# Patient Record
Sex: Female | Born: 1965
Health system: Southern US, Community
[De-identification: ages and names within clinical notes are randomized; demographics above are authoritative.]

## PROBLEM LIST (undated history)

## (undated) DIAGNOSIS — J45909 Unspecified asthma, uncomplicated: Secondary | ICD-10-CM

## (undated) DIAGNOSIS — Z9889 Other specified postprocedural states: Secondary | ICD-10-CM

## (undated) DIAGNOSIS — I10 Essential (primary) hypertension: Secondary | ICD-10-CM

## (undated) DIAGNOSIS — R112 Nausea with vomiting, unspecified: Secondary | ICD-10-CM

---

## 2004-08-07 DIAGNOSIS — Z9889 Other specified postprocedural states: Secondary | ICD-10-CM

## 2004-08-07 DIAGNOSIS — R112 Nausea with vomiting, unspecified: Secondary | ICD-10-CM

## 2004-08-07 HISTORY — PX: TUBAL LIGATION: SHX77

## 2004-08-07 HISTORY — DX: Nausea with vomiting, unspecified: Z98.890

## 2004-08-07 HISTORY — DX: Nausea with vomiting, unspecified: R11.2

## 2004-12-30 ENCOUNTER — Other Ambulatory Visit: Admission: RE | Admit: 2004-12-30 | Discharge: 2004-12-30 | Payer: Self-pay | Admitting: Obstetrics and Gynecology

## 2005-01-25 ENCOUNTER — Ambulatory Visit (HOSPITAL_COMMUNITY): Admission: RE | Admit: 2005-01-25 | Discharge: 2005-01-25 | Payer: Self-pay | Admitting: Obstetrics and Gynecology

## 2005-05-15 ENCOUNTER — Emergency Department (HOSPITAL_COMMUNITY): Admission: EM | Admit: 2005-05-15 | Discharge: 2005-05-15 | Payer: Self-pay | Admitting: Emergency Medicine

## 2005-07-12 ENCOUNTER — Emergency Department (HOSPITAL_COMMUNITY): Admission: EM | Admit: 2005-07-12 | Discharge: 2005-07-12 | Payer: Self-pay | Admitting: Emergency Medicine

## 2005-07-17 ENCOUNTER — Emergency Department (HOSPITAL_COMMUNITY): Admission: EM | Admit: 2005-07-17 | Discharge: 2005-07-17 | Payer: Self-pay | Admitting: Emergency Medicine

## 2006-01-08 ENCOUNTER — Emergency Department (HOSPITAL_COMMUNITY): Admission: EM | Admit: 2006-01-08 | Discharge: 2006-01-09 | Payer: Self-pay | Admitting: Emergency Medicine

## 2006-01-09 ENCOUNTER — Other Ambulatory Visit: Admission: RE | Admit: 2006-01-09 | Discharge: 2006-01-09 | Payer: Self-pay | Admitting: Obstetrics and Gynecology

## 2006-02-09 ENCOUNTER — Ambulatory Visit (HOSPITAL_COMMUNITY): Admission: RE | Admit: 2006-02-09 | Discharge: 2006-02-09 | Payer: Self-pay | Admitting: Obstetrics and Gynecology

## 2006-02-09 ENCOUNTER — Encounter (INDEPENDENT_AMBULATORY_CARE_PROVIDER_SITE_OTHER): Payer: Self-pay | Admitting: *Deleted

## 2006-05-28 ENCOUNTER — Emergency Department (HOSPITAL_COMMUNITY): Admission: EM | Admit: 2006-05-28 | Discharge: 2006-05-28 | Payer: Self-pay | Admitting: Emergency Medicine

## 2007-02-04 ENCOUNTER — Other Ambulatory Visit: Admission: RE | Admit: 2007-02-04 | Discharge: 2007-02-04 | Payer: Self-pay | Admitting: Obstetrics and Gynecology

## 2008-01-01 ENCOUNTER — Ambulatory Visit: Payer: Self-pay | Admitting: Internal Medicine

## 2008-01-02 ENCOUNTER — Ambulatory Visit: Payer: Self-pay | Admitting: *Deleted

## 2008-02-18 ENCOUNTER — Ambulatory Visit: Payer: Self-pay | Admitting: Internal Medicine

## 2008-02-18 LAB — CONVERTED CEMR LAB
AST: 14 units/L (ref 0–37)
Alkaline Phosphatase: 51 units/L (ref 39–117)
Basophils Absolute: 0 10*3/uL (ref 0.0–0.1)
Basophils Relative: 0 % (ref 0–1)
Chloride: 105 meq/L (ref 96–112)
Creatinine, Ser: 0.62 mg/dL (ref 0.40–1.20)
Eosinophils Absolute: 0.2 10*3/uL (ref 0.0–0.7)
Eosinophils Relative: 2 % (ref 0–5)
HCT: 39.3 % (ref 36.0–46.0)
HDL: 36 mg/dL — ABNORMAL LOW (ref >39)
Hemoglobin, Urine: NEGATIVE
Ketones, ur: NEGATIVE mg/dL
LDL Cholesterol: 127 mg/dL — ABNORMAL HIGH (ref 0–99)
Lymphocytes Relative: 41 % (ref 12–46)
MCV: 83.1 fL (ref 78.0–100.0)
Monocytes Relative: 7 % (ref 3–12)
Neutro Abs: 4.8 10*3/uL (ref 1.7–7.7)
Neutrophils Relative %: 49 % (ref 43–77)
Platelets: 354 10*3/uL (ref 150–400)
Potassium: 4.2 meq/L (ref 3.5–5.3)
Protein, ur: NEGATIVE mg/dL
RBC: 4.73 M/uL (ref 3.87–5.11)
RDW: 13.5 % (ref 11.5–15.5)
Sodium: 138 meq/L (ref 135–145)
Total CHOL/HDL Ratio: 5.2
Triglycerides: 117 mg/dL (ref <150)

## 2008-02-19 ENCOUNTER — Encounter (INDEPENDENT_AMBULATORY_CARE_PROVIDER_SITE_OTHER): Payer: Self-pay | Admitting: Internal Medicine

## 2008-02-29 ENCOUNTER — Encounter: Payer: Self-pay | Admitting: Internal Medicine

## 2008-02-29 ENCOUNTER — Ambulatory Visit: Payer: Self-pay | Admitting: Internal Medicine

## 2008-03-06 ENCOUNTER — Ambulatory Visit (HOSPITAL_COMMUNITY): Admission: RE | Admit: 2008-03-06 | Discharge: 2008-03-06 | Payer: Self-pay | Admitting: Internal Medicine

## 2008-03-17 ENCOUNTER — Encounter: Admission: RE | Admit: 2008-03-17 | Discharge: 2008-03-17 | Payer: Self-pay | Admitting: Internal Medicine

## 2008-12-11 ENCOUNTER — Emergency Department (HOSPITAL_COMMUNITY): Admission: EM | Admit: 2008-12-11 | Discharge: 2008-12-11 | Payer: Self-pay | Admitting: Emergency Medicine

## 2008-12-11 ENCOUNTER — Encounter: Admission: RE | Admit: 2008-12-11 | Discharge: 2008-12-11 | Payer: Self-pay | Admitting: Internal Medicine

## 2009-03-09 ENCOUNTER — Ambulatory Visit (HOSPITAL_COMMUNITY): Admission: RE | Admit: 2009-03-09 | Discharge: 2009-03-09 | Payer: Self-pay | Admitting: Internal Medicine

## 2010-02-14 ENCOUNTER — Ambulatory Visit (HOSPITAL_COMMUNITY): Admission: RE | Admit: 2010-02-14 | Discharge: 2010-02-14 | Payer: Self-pay | Admitting: Obstetrics and Gynecology

## 2010-08-29 ENCOUNTER — Encounter: Payer: Self-pay | Admitting: Internal Medicine

## 2010-09-01 ENCOUNTER — Other Ambulatory Visit (HOSPITAL_COMMUNITY): Payer: Self-pay | Admitting: Obstetrics and Gynecology

## 2010-09-01 DIAGNOSIS — Z1239 Encounter for other screening for malignant neoplasm of breast: Secondary | ICD-10-CM

## 2010-09-01 DIAGNOSIS — Z1231 Encounter for screening mammogram for malignant neoplasm of breast: Secondary | ICD-10-CM

## 2010-10-20 ENCOUNTER — Other Ambulatory Visit (HOSPITAL_COMMUNITY): Payer: Self-pay | Admitting: Obstetrics and Gynecology

## 2010-10-20 ENCOUNTER — Ambulatory Visit (HOSPITAL_COMMUNITY)
Admission: RE | Admit: 2010-10-20 | Discharge: 2010-10-20 | Disposition: A | Discharge status: Home / Self Care | Payer: 59 | Source: Ambulatory Visit | Attending: Obstetrics and Gynecology | Admitting: Obstetrics and Gynecology

## 2010-10-20 ENCOUNTER — Other Ambulatory Visit: Payer: Self-pay | Admitting: Obstetrics and Gynecology

## 2010-10-20 DIAGNOSIS — Z1231 Encounter for screening mammogram for malignant neoplasm of breast: Secondary | ICD-10-CM

## 2010-12-23 NOTE — Op Note (Signed)
[name], [name]               ACCOUNT NO.:  [id number]   MEDICAL RECORD NO.:  [id number]          PATIENT TYPE:  AMB   LOCATION:  SDC                           FACILITY:  [facility]   PHYSICIAN:  James A. Ashley Royalty, M.D.DATE OF BIRTH:  [birth year]   DATE OF PROCEDURE:  01/25/2005  DATE OF DISCHARGE:                                 OPERATIVE REPORT   PREOPERATIVE DIAGNOSIS:  Desire for attempted permanent sterilization.   POSTOPERATIVE DIAGNOSIS:  Desire for attempted permanent sterilization.   OPERATION/PROCEDURE:  Laparoscopic bilateral tubal sterilization procedure  (Falope ring).   SURGEON:  Rudy Jew. Ashley Royalty, M.D.   ANESTHESIA:  General.   ESTIMATED BLOOD LOSS:  50 mL.   COMPLICATIONS:  None.   PACKS AND DRAINS:  None.   DESCRIPTION OF PROCEDURE:  The patient was taken to the operating room and  placed in the dorsal supine position.  After general anesthesia was  administered, she was prepped and draped in the usual sterile manner for  abdominal and vaginal surgery.  Posterior weighted retractor was placed per  vagina.  A anterior lip of the cervix grasped with a single-tooth tenaculum  and the Jarcho uterine manipulator was placed per cervix and held in place  with the tenaculum.  The bladder was drained with the red rubber catheter.   A 1.5 cm infraumbilical incision was made in the longitudinal plane.  Veress  needle was inserted into the abdominal cavity using instillation of saline  and hanging drop techniques.  Approximately 3 L of CO2 were instilled at 1 L  per minute to create a pneumoperitoneum.  Next, a disposable size 11 trocar  was placed in the abdominal cavity.  Its location was verified by placement  of the laparoscope.  There was no evidence of any trauma.  The size 8 mm  Falope ring trocar was placed in the suprapubic region in the midline using  transillumination and direct visualization techniques.  The pelvis was  thoroughly surveyed.  The uterus was  normal size, shape and contour without  evidence of any fibroids or endometriosis.  The ovaries were normal size,  shape and contour without evidence of any cysts or endometriosis.  Fallopian  tubes were normal size, shape, contour, and length with luxuriant fimbria.  Appropriate photos were obtained.   Attention was then turned to the tubal sterilization procedure.  The right  fallopian tube was grasped and traced to its fimbriated end.  The Falope  ring was applied to the distal ampullary portion.  An excellent knuckle of  tube was noted to be contained within the ring.  Excellent blanching of  tissue was noted.  Next, the left fallopian tube was grasped and traced to  its fimbriated end.  An avascular area in the distal portion of the isthmic  region was chosen for ring placement.  Falope ring was applied without  difficulty.  An excellent knuckle of tube was noted to be contained within  the ring.  Excellent blanching of tissue was noted.  __________ were  obtained.   At this point the patient was felt to have  benefitted maximally from the  surgical procedure.  The abdominal instruments were removed and  pneumoperitoneum evacuated.  Fascial defects were closed with 0 Vicryl in an  interrupted fashion.  The skin was closed with 3-0 Monocryl in a  subcuticular fashion.  The vaginal instruments were removed and procedure  terminated.  Prior to termination, approximately 10 mL of 0.25% bupivacaine  was instilled into the incisions to aid in postoperative analgesia.   The patient tolerated the procedure extremely well and was returned to the  recovery room in good condition.       JAM/MEDQ  D:  01/25/2005  T:  01/26/2005  Job:  [id number]

## 2010-12-23 NOTE — Op Note (Signed)
[name], [name]               ACCOUNT NO.:  [id number]   MEDICAL RECORD NO.:  [id number]          PATIENT TYPE:  AMB   LOCATION:  SDC                           [facility]   PHYSICIAN:  James A. Ashley Royalty, M.D.DATE OF BIRTH:  [birth year]   DATE OF PROCEDURE:  02/09/2006  DATE OF DISCHARGE:                                 OPERATIVE REPORT   PREOPERATIVE DIAGNOSES:  1.  Abnormal uterine bleeding.  2.  Pelvic pain.   POSTOPERATIVE DIAGNOSES:  1.  Abnormal uterine bleeding.  2.  Probable endometriosis, pathology pending.   OPERATION PERFORMED:  1.  Diagnostic hysteroscopy.  2.  Dilation and curettage.  3.  Diagnostic and operative laparoscopy.  4.  Peritoneal biopsies.  5.  Fulguration of apparent endometriosis.   SURGEON:  Rudy Jew. Ashley Royalty, M.D.   ANESTHESIA:  General.   ESTIMATED BLOOD LOSS:  Less than 50 mL.   COMPLICATIONS:  None.   PACKS AND DRAINS:  None.   DESCRIPTION OF PROCEDURE:  Patient was taken to the operating room and  placed in the dorsal supine position.  After general anesthetic was  administered, she was placed in lithotomy position, prepped and draped in  the usual manner for upper abdominal and vaginal surgery.  The posterior  weighted retractor was placed per vagina.  The anterior lip of the cervix  was grasped with a single toothed tenaculum.  The uterus was gently sounded  to approximately 9 cm.  It was slightly retroverted and the canal deviated  slightly to the patient's right.  An attempt was made to dilate the cervix.  It was extremely difficult to dilate with [facility] dilators.  With significant  difficulty, the cervix was dilated to a size 29 [place] using [employer] dilators.  The standard resectoscope was placed into the uterine cavity.  Unfortunately, due to the tight cervix, it could not be successfully  advanced.  An attempt was made to dilate the cervix to a size 9 [place] and  these efforts were unsuccessful.  Next, a diagnostic  hysteroscope was placed  with the diameter approximately 15 [place].  Sorbitol was used as a  distention medium. The tubal ostia were visualized and photographed.  However, despite the team's best efforts, the cavity could not be visualized  consistently due to bubbles, etc.  Finally, a resectoscope was chosen with  approximately a 23 French diameter.  Again the unit could not be  successfully placed into the cavity.  The cervix was noted to have two  lacerations on it from the incumbent traction required.  Hence, it was felt  in the patient's best interests to avoid any further attempts at  intrauterine visualization.   Attention was then turned to the uterine curettage.  A medium sized curette  was introduced into the uterine cavity.  Sharp curettage was conducted.  First a four quadrant technique was used.  Then a therapeutic technique was  used.  The curettings were submitted to pathology for histologic studies.  Attention was then turned to the diagnostic laparoscopy.  A 1.2 cm  infraumbilical incision was in the longitudinal plane.  Veress needle was  inserted into the abdominal cavity using instillation of saline and hanging  drop techniques.  Approximately 3 L of CO2 were instilled at 1.5 L per  minute to create a pneumoperitoneum.  Next, the size 1.2 laparoscopic trocar  was placed into the abdominal cavity.  It's location was verified by  placement of the laparoscope.  There was no evidence of any trauma.  Next,  pneumoperitoneum was maintained throughout with CO2.  5 mm trocars were  placed in the left and right lower quadrants respectively, using  transillumination and direct visualization techniques.  The pelvis was  thoroughly surveyed.  The uterus was normal size, shape and contour.  The  fallopian tubes were normal size, shape, contour and length with luxuriant  fimbria.  There were falope rings present bilaterally consistent with the  patient's known history of a tubal  sterilization procedure.  The anterior  cul-de-sac was clean.  With elevation of the uterus, the posterior cul-de-  sac was clean.  However, on the left pelvic sidewall there were two or three  foci of apparent endometriosis.  The ovaries per se were normal size, shape  and contour without evidence of any cysts or obvious endometriosis.  Appropriate photos were obtained.  Remainder of the peritoneal surfaces were  smooth and glistening.  There was a small strictured area noted anteriorly  just to the right of midline in the vesicouterine peritoneal fold; however,  there was no nodularity or evidence of any active endometriosis or other  problem associated with it.   Next, a small biopsy was taken from the left pelvic side wall by elevating  the peritoneum.  The tissue was submitted to pathology for histologic  studies.  The area of the biopsy was well above the plane of the ureter.  Next, the bipolar was used to fulgurate any remaining endometriotic  implants.  Careful attention was paid to coagulate only superficially.  Again the ureter was well below the plane of the fulguration.   At this point the patient was felt to have benefited maximally from the  surgical procedure.  The abdominal instruments were removed and the  pneumoperitoneum evacuated.  Fascial defects were closed with 0 Vicryl in  interrupted fashion.  The skin was closed with 3-0 Monocryl in a  subcuticular fashion.  The inferior skin ports were closed with Dermabond.  Vaginal instruments were removed.  The cervical laceration was closed with 2-  0 chromic in a figure-of-eight fashion.  Hemostasis noted and the procedure  was terminated.  It should be mentioned that prior to the laparoscopy, the  bladder was drained with a red rubber catheter.  The patient tolerated the  procedure extremely well and was returned to the recovery room in good  condition.      James A. Ashley Royalty, M.D.  Electronically  Signed    JAM/MEDQ  D:  02/09/2006  T:  02/09/2006  Job:  [id number]

## 2010-12-23 NOTE — H&P (Signed)
NAMESHANNELLE, ALGUIRE               ACCOUNT NO.:  [id number]   MEDICAL RECORD NO.:  [id number]          PATIENT TYPE:  AMB   LOCATION:  SDC                           [facility]   PHYSICIAN:  James A. Ashley Royalty, M.D.DATE OF BIRTH:  12/29/1965   DATE OF ADMISSION:  01/25/2005  DATE OF DISCHARGE:                                HISTORY & PHYSICAL   Note, a portion of the evaluation for this history and physical was obtained  in the office setting.  This is a 45 year old gravida 1, para 1 who states a  desire for attempt at permanent surgical sterilization.  No other  gynecologic problems at this time.   MEDICATIONS:  None.   PAST MEDICAL HISTORY:  History of benign renal cyst.   PAST SURGICAL HISTORY:  History of cryo surgery of the cervix as well as a  cesarean section December 1995.   ALLERGIES:  None.   FAMILY HISTORY:  Positive for kidney disease and endometriosis as well as  hypertension.   SOCIAL HISTORY:  The patient smokes five to seven cigarettes per day.  She  denies significant use of alcohol.   REVIEW OF SYSTEMS:  Noncontributory.   PHYSICAL EXAMINATION:  GENERAL:  Well-developed, well-nourished, pleasant  female in no acute distress.  VITAL SIGNS:  Afebrile.  Vital signs stable.  SKIN:  Warm and dry without lesions.  LYMPH:  No supraclavicular, cervical, or inguinal adenopathy.  HEENT:  Normocephalic.  CHEST:  Lungs are clear.  CARDIAC:  Regular rate and rhythm without murmurs, rubs, or gallops.  BREASTS:  Deferred.  ABDOMEN:  Soft and nontender without masses or organomegaly.  MUSCULOSKELETAL:  Reveals full range of motion without edema, cyanosis, or  CVA tenderness.  PELVIC:  External genitalia within normal limits.  Vagina and cervix were  without gross lesions.  Bimanual examination reveals uterus to be  approximately 8 x 4 x 4 cm.  No adnexal masses were palpable.   Last Pap Dec 30, 2004 is negative.   IMPRESSION:  Desire for attempt at permanent  surgical sterilization.   PLAN:  Laparoscopic bilateral tubal sterilization procedure.  Risks,  benefits, complications, and alternatives fully discussed with patient.  Permanency and failure rates of various techniques including, but not  limited to, Falope ring, bipolar cautery, and mini laparotomy with partial  salpingectomy discussed and accepted.  Questions invited and answered.       JAM/MEDQ  D:  01/25/2005  T:  01/25/2005  Job:  [id number]

## 2010-12-23 NOTE — H&P (Signed)
[name], [name]               ACCOUNT NO.:  [id number]   MEDICAL RECORD NO.:  [id number]          PATIENT TYPE:  AMB   LOCATION:  SDC                           [facility]   PHYSICIAN:  James A. Ashley Royalty, M.D.DATE OF BIRTH:  16-Nov-1965   DATE OF ADMISSION:  02/09/2006  DATE OF DISCHARGE:                                HISTORY & PHYSICAL   This is a 45 year old gravida 1, para 1, who presented to me early June  2007, complaining of abnormal uterine bleeding, as well as pelvic pain,  onset approximately one month prior.  She states she is experiencing  intermenstrual bleeding.  With respect to the pain, she describes it as  lower abdominal and pelvic, equal bilaterally.  There are no associated  symptoms.  This seems to be better with ibuprofen and also lying on her  side.  It seems to be worse with standing or sitting.  The patient told me,  January 09, 2006, that she went to [facility] and an ultrasound  suggested fibroid tumors.  However, subsequent ultrasound report was  obtained and I could appreciate no evidence of same.  She returned, January 12, 2006, stating that the pain was debilitating to her and she desired surgical  intervention.  Hence, she is for hysteroscopy, as well as laparoscopy.   MEDICATIONS:  None chronic.   PAST MEDICAL HISTORY:  MEDICAL:  Negative.  SURGICAL:  C-section in 1995.  Prior surgery:  Laparoscopic tubal  sterilization procedure in 2006.   ALLERGIES:  None.   FAMILY HISTORY:  Noncontributory.   SOCIAL HISTORY:  The patient is a smoker.  She uses alcohol socially.   REVIEW OF SYSTEMS:  Noncontributory.   PHYSICAL EXAMINATION:  GENERAL:  Well-developed, well-nourished, pleasant,  black female, in no acute distress.  VITAL SIGNS:  Afebrile, vital signs stable.  CHEST:  Lungs are clear.  CARDIAC:  Regular rate and rhythm.  ABDOMEN:  Soft and nontender.  MUSCULOSKELETAL:  No CVA tenderness.  PELVIC EXAMINATION:  (Please see most  recent office notes.)  Uterus is  normal size, shape and contour, slightly nodular.  There are no adnexal  masses palpable.   IMPRESSION:  1.  Pelvic pain - debilitating.  Differential includes adnexal pathology,      endometriosis, adhesions, musculoskeletal, primary GI.  2.  Abnormal uterine bleeding - rule out structural abnormality, eg. polyp,      fibroid.   PLAN:  1.  Diagnostic/operative laparoscopy.  2.  Diagnostic/operative hysteroscopy.   Risks and benefits, complications in general terms were fully discussed with  the patient.  Possible need for unilateral salpingo-oophorectomy discussed  and accepted.  Possible need for exploratory laparotomy discussed and  accepted.  Questions invited and answered.      James A. Ashley Royalty, M.D.  Electronically Signed     JAM/MEDQ  D:  02/09/2006  T:  02/09/2006  Job:  [id number]

## 2011-02-17 ENCOUNTER — Ambulatory Visit (HOSPITAL_COMMUNITY)
Admission: RE | Admit: 2011-02-17 | Discharge: 2011-02-17 | Disposition: A | Discharge status: Home / Self Care | Payer: 59 | Source: Ambulatory Visit | Attending: Obstetrics and Gynecology | Admitting: Obstetrics and Gynecology

## 2011-02-17 DIAGNOSIS — Z1231 Encounter for screening mammogram for malignant neoplasm of breast: Secondary | ICD-10-CM | POA: Insufficient documentation

## 2011-10-11 ENCOUNTER — Ambulatory Visit: Payer: Self-pay

## 2011-10-11 ENCOUNTER — Emergency Department (HOSPITAL_COMMUNITY)
Admission: EM | Admit: 2011-10-11 | Discharge: 2011-10-11 | Disposition: A | Discharge status: Home / Self Care | Payer: Self-pay | Attending: Emergency Medicine | Admitting: Emergency Medicine

## 2011-10-11 ENCOUNTER — Encounter (HOSPITAL_COMMUNITY): Payer: Self-pay | Admitting: *Deleted

## 2011-10-11 DIAGNOSIS — J069 Acute upper respiratory infection, unspecified: Secondary | ICD-10-CM | POA: Insufficient documentation

## 2011-10-11 DIAGNOSIS — I10 Essential (primary) hypertension: Secondary | ICD-10-CM | POA: Insufficient documentation

## 2011-10-11 DIAGNOSIS — F17200 Nicotine dependence, unspecified, uncomplicated: Secondary | ICD-10-CM | POA: Insufficient documentation

## 2011-10-11 HISTORY — DX: Essential (primary) hypertension: I10

## 2011-10-11 MED ORDER — LORATADINE 10 MG PO TABS: 10.0000 mg | ORAL_TABLET | Freq: Every day | ORAL | Status: DC

## 2011-10-11 MED ORDER — ALBUTEROL SULFATE HFA 108 (90 BASE) MCG/ACT IN AERS
2.0000 | INHALATION_SPRAY | RESPIRATORY_TRACT | Status: DC | PRN
  Administered 2011-10-11: 2 via RESPIRATORY_TRACT
  Filled 2011-10-11: qty 6.7

## 2011-10-11 MED ORDER — HYDROCODONE-ACETAMINOPHEN 7.5-500 MG/15ML PO SOLN: 10.0000 mL | Freq: Once | ORAL | Status: AC

## 2011-10-11 NOTE — ED Provider Notes (Signed)
History     CSN: [id number]  Arrival date & time 10/11/11  1101   First MD Initiated Contact with Patient 10/11/11 1105      Chief Complaint  Patient presents with  . Generalized Body Aches  . Otalgia    (Consider location/radiation/quality/duration/timing/severity/associated sxs/prior treatment) Patient is a 46 y.o. female presenting with URI. The history is provided by the patient.  URI The primary symptoms include ear pain, cough and myalgias. Primary symptoms do not include fever, nausea, vomiting, arthralgias or rash. The current episode started 2 days ago. This is a new problem.  Symptoms associated with the illness include plugged ear sensation, sinus pressure, congestion and rhinorrhea. The illness is not associated with chills. Associated symptoms comments: She reports viral symptoms she "gets every year" of upper respiratory congestion, dry cough or cough productive of alternating yellow and clear phlegm, ear pain and popping, generalized aches. No fever.  .    Past Medical History  Diagnosis Date  . Hypertension     History reviewed. No pertinent past surgical history.  History reviewed. No pertinent family history.  History  Substance Use Topics  . Smoking status: Current Everyday Smoker  . Smokeless tobacco: Not on file  . Alcohol Use: No    OB History    Grav Para Term Preterm Abortions TAB SAB Ect Mult Living                  Review of Systems  Constitutional: Negative for fever and chills.  HENT: Positive for ear pain, congestion, rhinorrhea and sinus pressure.   Respiratory: Positive for cough.   Cardiovascular: Negative.   Gastrointestinal: Negative.  Negative for nausea and vomiting.  Musculoskeletal: Positive for myalgias. Negative for arthralgias.  Skin: Negative.  Negative for rash.  Neurological: Negative.     Allergies  Review of patient's allergies indicates no known allergies.  Home Medications   Current Outpatient Rx  Name Route  Sig Dispense Refill  . ACETAMINOPHEN 500 MG PO TABS Oral Take 1,000 mg by mouth every 6 (six) hours as needed. For pain.    Marland Kitchen AMLODIPINE-OLMESARTAN 5-20 MG PO TABS Oral Take 1 tablet by mouth daily.    Marland Kitchen CALCIUM CARBONATE PO Oral Take 1 tablet by mouth daily.    . GUAIFENESIN ER 600 MG PO TB12 Oral Take 1,200 mg by mouth daily as needed. For congestion.    Marland Kitchen VITAMIN D (CHOLECALCIFEROL) PO Oral Take 1 tablet by mouth 2 (two) times daily.      BP 146/80  Pulse 90  Temp 98.7 F (37.1 C)  Resp 22  Ht 5\' 3"  (1.6 m)  Wt 175 lb (79.379 kg)  BMI 31.00 kg/m2  SpO2 100%  Physical Exam  Constitutional: She appears well-developed and well-nourished.  HENT:  Head: Normocephalic.  Right Ear: External ear normal.  Left Ear: External ear normal.  Nose: Mucosal edema present. Right sinus exhibits frontal sinus tenderness. Left sinus exhibits frontal sinus tenderness.  Mouth/Throat: Oropharynx is clear and moist.  Neck: Normal range of motion. Neck supple.  Cardiovascular: Normal rate and normal heart sounds.   No murmur heard. Pulmonary/Chest: Effort normal and breath sounds normal. She has no wheezes. She has no rales.  Abdominal: Soft. Bowel sounds are normal. She exhibits no distension. There is no tenderness.  Musculoskeletal: Normal range of motion.  Lymphadenopathy:    She has no cervical adenopathy.  Skin: Skin is warm and dry. No pallor.    ED Course  Procedures (including  critical care time)  Labs Reviewed - No data to display No results found.   No diagnosis found.    MDM          Rodena Medin, PA-C 10/11/11 1151

## 2011-10-11 NOTE — Discharge Instructions (Signed)
Antibiotic Nonuse  Your caregiver felt that the infection or problem was not one that would be helped with an antibiotic. Infections may be caused by viruses or bacteria. Only a caregiver can tell which one of these is the likely cause of an illness. A cold is the most common cause of infection in both adults and children. A cold is a virus. Antibiotic treatment will have no effect on a viral infection. Viruses can lead to many lost days of work caring for sick children and many missed days of school. Children may catch as many as 10 "colds" or "flus" per year during which they can be tearful, cranky, and uncomfortable. The goal of treating a virus is aimed at keeping the ill person comfortable. Antibiotics are medications used to help the body fight bacterial infections. There are relatively few types of bacteria that cause infections but there are hundreds of viruses. While both viruses and bacteria cause infection they are very different types of germs. A viral infection will typically go away by itself within 7 to 10 days. Bacterial infections may spread or get worse without antibiotic treatment. Examples of bacterial infections are:  Sore throats (like strep throat or tonsillitis).   Infection in the lung (pneumonia).   Ear and skin infections.  Examples of viral infections are:  Colds or flus.   Most coughs and bronchitis.   Sore throats not caused by Strep.   Runny noses.  It is often best not to take an antibiotic when a viral infection is the cause of the problem. Antibiotics can kill off the helpful bacteria that we have inside our body and allow harmful bacteria to start growing. Antibiotics can cause side effects such as allergies, nausea, and diarrhea without helping to improve the symptoms of the viral infection. Additionally, repeated uses of antibiotics can cause bacteria inside of our body to become resistant. That resistance can be passed onto harmful bacterial. The next time  you have an infection it may be harder to treat if antibiotics are used when they are not needed. Not treating with antibiotics allows our own immune system to develop and take care of infections more efficiently. Also, antibiotics will work better for [place] when they are prescribed for bacterial infections. Treatments for a child that is ill may include:  Give extra fluids throughout the day to stay hydrated.   Get plenty of rest.   Only give your child over-the-counter or prescription medicines for pain, discomfort, or fever as directed by your caregiver.   The use of a cool mist humidifier may help stuffy noses.   Cold medications if suggested by your caregiver.  Your caregiver may decide to start you on an antibiotic if:  The problem you were seen for today continues for a longer length of time than expected.   You develop a secondary bacterial infection.  SEEK MEDICAL CARE IF:  Fever lasts longer than 5 days.   Symptoms continue to get worse after 5 to 7 days or become severe.   Difficulty in breathing develops.   Signs of dehydration develop (poor drinking, rare urinating, dark colored urine).   Changes in behavior or worsening tiredness (listlessness or lethargy).  Document Released: 10/02/2001 Document Revised: 07/13/2011 Document Reviewed: 03/31/2009 [facility] Patient Information 2012 [place], [place].   FOLLOW UP WITH DR. SANDERS FOR RECHECK IF SYMPTOMS PERSIST OR WORSEN. PUSH FLUIDS. TYLENOL FOR ACHES AND IF ANY FEVER DEVELOPS. TAKE MEDICATIONS AS PRESCRIBED (HYDROCODONE ELIXIR AND ROBITUSSIN AT NIGHT FOR COUGH, ALBUTEROL  INHALER DURING THE DAY FOR COUGH) AND AVOID THE USE OF DECONGESTANTS AS THEY CAN RAISE YOUR BLOOD PRESSURE. RETURN HERE AS NEEDED.

## 2011-10-11 NOTE — ED Notes (Signed)
Pt states she started to have a productive cough on Saturday. Pt states she is also having ear pain and feeling congested. Pt c/o generalized body aches

## 2011-10-12 NOTE — ED Provider Notes (Signed)
Medical screening examination/treatment/procedure(s) were performed by non-physician practitioner and as supervising physician I was immediately available for consultation/collaboration.  Tiea Manninen, MD 10/12/11 0726 

## 2012-03-26 ENCOUNTER — Emergency Department (HOSPITAL_COMMUNITY)
Admission: EM | Admit: 2012-03-26 | Discharge: 2012-03-26 | Disposition: A | Discharge status: Home / Self Care | Payer: Self-pay | Attending: Emergency Medicine | Admitting: Emergency Medicine

## 2012-03-26 ENCOUNTER — Encounter (HOSPITAL_COMMUNITY): Payer: Self-pay | Admitting: *Deleted

## 2012-03-26 ENCOUNTER — Emergency Department (HOSPITAL_COMMUNITY): Payer: Self-pay

## 2012-03-26 DIAGNOSIS — J9801 Acute bronchospasm: Secondary | ICD-10-CM

## 2012-03-26 DIAGNOSIS — J40 Bronchitis, not specified as acute or chronic: Secondary | ICD-10-CM

## 2012-03-26 DIAGNOSIS — F17200 Nicotine dependence, unspecified, uncomplicated: Secondary | ICD-10-CM | POA: Insufficient documentation

## 2012-03-26 DIAGNOSIS — I10 Essential (primary) hypertension: Secondary | ICD-10-CM | POA: Insufficient documentation

## 2012-03-26 DIAGNOSIS — J45909 Unspecified asthma, uncomplicated: Secondary | ICD-10-CM | POA: Insufficient documentation

## 2012-03-26 HISTORY — DX: Unspecified asthma, uncomplicated: J45.909

## 2012-03-26 MED ORDER — ALBUTEROL SULFATE HFA 108 (90 BASE) MCG/ACT IN AERS
2.0000 | INHALATION_SPRAY | RESPIRATORY_TRACT | Status: DC
  Administered 2012-03-26: 2 via RESPIRATORY_TRACT
  Filled 2012-03-26: qty 6.7

## 2012-03-26 MED ORDER — ALBUTEROL SULFATE (5 MG/ML) 0.5% IN NEBU
5.0000 mg | INHALATION_SOLUTION | Freq: Once | RESPIRATORY_TRACT | Status: AC
  Administered 2012-03-26: 5 mg via RESPIRATORY_TRACT
  Filled 2012-03-26: qty 1

## 2012-03-26 MED ORDER — IPRATROPIUM BROMIDE 0.02 % IN SOLN
0.5000 mg | Freq: Once | RESPIRATORY_TRACT | Status: AC
  Administered 2012-03-26: 0.5 mg via RESPIRATORY_TRACT
  Filled 2012-03-26: qty 2.5

## 2012-03-26 MED ORDER — PREDNISONE 20 MG PO TABS
60.0000 mg | ORAL_TABLET | Freq: Once | ORAL | Status: AC
  Administered 2012-03-26: 60 mg via ORAL
  Filled 2012-03-26: qty 3

## 2012-03-26 NOTE — ED Notes (Signed)
Patient transported to X-ray 

## 2012-03-26 NOTE — ED Provider Notes (Signed)
History     CSN: [id number]  Arrival date & time 03/26/12  0406   First MD Initiated Contact with Patient 03/26/12 [phone]      Chief Complaint  Patient presents with  . Shortness of Breath     The history is provided by the patient.   the patient reports worsening cough over the past 3 weeks.  Her cough is reported as dry and nonproductive.  She denies fevers or chills.  She's had no unilateral leg swelling.  She denies history of pulmonary embolism or DVT.  She denies chest pain or abdominal pain.  She has no nausea or vomiting.  Her symptoms are mild to moderate in severity.  Nothing improves or worsens her symptoms.  Past Medical History  Diagnosis Date  . Hypertension   . Asthma     History reviewed. No pertinent past surgical history.  No family history on file.  History  Substance Use Topics  . Smoking status: Current Everyday Smoker  . Smokeless tobacco: Not on file  . Alcohol Use: No    OB History    Grav Para Term Preterm Abortions TAB SAB Ect Mult Living                  Review of Systems  Respiratory: Positive for shortness of breath.   All other systems reviewed and are negative.    Allergies  Shellfish allergy  Home Medications   Current Outpatient Rx  Name Route Sig Dispense Refill  . AMLODIPINE-OLMESARTAN 5-20 MG PO TABS Oral Take 1 tablet by mouth daily.    Marland Kitchen CALCIUM CARBONATE PO Oral Take 1 tablet by mouth daily.    . GUAIFENESIN ER 600 MG PO TB12 Oral Take 1,200 mg by mouth daily as needed. For congestion.    Marland Kitchen VITAMIN D (CHOLECALCIFEROL) PO Oral Take 1 tablet by mouth 2 (two) times daily.      BP 126/65  Pulse 86  Temp 98.7 F (37.1 C)  Resp 24  SpO2 94%  LMP 03/12/2012  Physical Exam  Nursing note and vitals reviewed. Constitutional: She is oriented to person, place, and time. She appears well-developed and well-nourished. No distress.  HENT:  Head: Normocephalic and atraumatic.  Eyes: EOM are normal.  Neck: Normal range of  motion.  Cardiovascular: Normal rate, regular rhythm and normal heart sounds.   Pulmonary/Chest: Effort normal and breath sounds normal.  Abdominal: Soft. She exhibits no distension. There is no tenderness.  Musculoskeletal: Normal range of motion.  Neurological: She is alert and oriented to person, place, and time.  Skin: Skin is warm and dry.  Psychiatric: She has a normal mood and affect. Judgment normal.    ED Course  Procedures (including critical care time)  Labs Reviewed - No data to display Dg Chest 2 View  03/26/2012  *RADIOLOGY REPORT*  Clinical Data: Cough.  Shortness of breath.  Chest pain.  CHEST - 2 VIEW  Comparison: 12/11/2008  Findings: The heart size and pulmonary vascularity are normal. The lungs appear clear and expanded without focal air space disease or consolidation. No blunting of the costophrenic angles.  No pneumothorax.  Mediastinal contours appear intact.  No significant change since previous study.  IMPRESSION: No evidence of active pulmonary disease.   Original Report Authenticated By: Marlon Pel, M.D.     I personally reviewed the imaging tests through PACS system  I reviewed available ER/hospitalization records thought the EMR   1. Bronchitis   2. Bronchospasm  MDM  The patient has bronchospasm.  Chest x-ray clear.  Vital signs are normal.  Doubt pulmonary and wasn't.  She feels much better after albuterol.  Home with albuterol inhaler.         Lyanne Co, MD 03/26/12 [phone]

## 2012-03-26 NOTE — ED Notes (Signed)
Pt is coughing, productive green sputum, states chest and rib cage sore d/t coughing

## 2012-03-26 NOTE — ED Notes (Signed)
Pt c/o asthma symptoms off and on x 3 wks; worse tonight; sats 93%

## 2012-03-26 NOTE — ED Notes (Signed)
Pt escorted to discharge window, in no distress. 

## 2012-04-03 ENCOUNTER — Emergency Department (HOSPITAL_COMMUNITY): Payer: Self-pay

## 2012-04-03 ENCOUNTER — Emergency Department (HOSPITAL_COMMUNITY)
Admission: EM | Admit: 2012-04-03 | Discharge: 2012-04-04 | Disposition: A | Discharge status: Home / Self Care | Payer: Self-pay | Attending: Emergency Medicine | Admitting: Emergency Medicine

## 2012-04-03 ENCOUNTER — Encounter (HOSPITAL_COMMUNITY): Payer: Self-pay | Admitting: Family Medicine

## 2012-04-03 DIAGNOSIS — R0989 Other specified symptoms and signs involving the circulatory and respiratory systems: Secondary | ICD-10-CM | POA: Insufficient documentation

## 2012-04-03 DIAGNOSIS — I10 Essential (primary) hypertension: Secondary | ICD-10-CM | POA: Insufficient documentation

## 2012-04-03 DIAGNOSIS — R0609 Other forms of dyspnea: Secondary | ICD-10-CM | POA: Insufficient documentation

## 2012-04-03 DIAGNOSIS — J9809 Other diseases of bronchus, not elsewhere classified: Secondary | ICD-10-CM

## 2012-04-03 DIAGNOSIS — J398 Other specified diseases of upper respiratory tract: Secondary | ICD-10-CM | POA: Insufficient documentation

## 2012-04-03 DIAGNOSIS — R0602 Shortness of breath: Secondary | ICD-10-CM | POA: Insufficient documentation

## 2012-04-03 DIAGNOSIS — R079 Chest pain, unspecified: Secondary | ICD-10-CM | POA: Insufficient documentation

## 2012-04-03 DIAGNOSIS — R062 Wheezing: Secondary | ICD-10-CM | POA: Insufficient documentation

## 2012-04-03 DIAGNOSIS — R112 Nausea with vomiting, unspecified: Secondary | ICD-10-CM | POA: Insufficient documentation

## 2012-04-03 LAB — CBC
MCV: 80.7 fL (ref 78.0–100.0)
Platelets: 390 10*3/uL (ref 150–400)
RBC: 5.18 MIL/uL — ABNORMAL HIGH (ref 3.87–5.11)
RDW: 13.1 % (ref 11.5–15.5)
WBC: 13 10*3/uL — ABNORMAL HIGH (ref 4.0–10.5)

## 2012-04-03 LAB — BASIC METABOLIC PANEL
CO2: 23 mEq/L (ref 19–32)
Chloride: 102 mEq/L (ref 96–112)
Creatinine, Ser: 0.63 mg/dL (ref 0.50–1.10)
GFR calc Af Amer: >90 mL/min (ref >90)
Potassium: 4 mEq/L (ref 3.5–5.1)
Sodium: 137 mEq/L (ref 135–145)

## 2012-04-03 LAB — PRO B NATRIURETIC PEPTIDE: Pro B Natriuretic peptide (BNP): 24.1 pg/mL (ref 0–125)

## 2012-04-03 LAB — POCT I-STAT TROPONIN I: Troponin i, poc: 0 ng/mL (ref 0.00–0.08)

## 2012-04-03 MED ORDER — PREDNISONE 20 MG PO TABS
60.0000 mg | ORAL_TABLET | Freq: Once | ORAL | Status: AC
  Administered 2012-04-03: 60 mg via ORAL
  Filled 2012-04-03: qty 3

## 2012-04-03 MED ORDER — ALBUTEROL SULFATE HFA 108 (90 BASE) MCG/ACT IN AERS
2.0000 | INHALATION_SPRAY | RESPIRATORY_TRACT | Status: DC
  Administered 2012-04-04: 2 via RESPIRATORY_TRACT
  Filled 2012-04-03: qty 6.7

## 2012-04-03 MED ORDER — PREDNISONE 20 MG PO TABS: 60.0000 mg | ORAL_TABLET | Freq: Every day | ORAL | Status: DC

## 2012-04-03 MED ORDER — ASPIRIN 81 MG PO CHEW
324.0000 mg | CHEWABLE_TABLET | Freq: Once | ORAL | Status: AC
  Administered 2012-04-03: 324 mg via ORAL
  Filled 2012-04-03: qty 4

## 2012-04-03 MED ORDER — ALBUTEROL (5 MG/ML) CONTINUOUS INHALATION SOLN
10.0000 mg/h | INHALATION_SOLUTION | RESPIRATORY_TRACT | Status: DC
  Administered 2012-04-03: 10 mg/h via RESPIRATORY_TRACT

## 2012-04-03 NOTE — ED Provider Notes (Signed)
History     CSN: [id number]  Arrival date & time 04/03/12  4098   First MD Initiated Contact with Patient 04/03/12 2113      Chief Complaint  Patient presents with  . Chest Pain  . Shortness of Breath  . Nausea  . Emesis    (Consider location/radiation/quality/duration/timing/severity/associated sxs/prior treatment) HPIAngelia A Mcelwain is a 46 y.o. female presenting with dyspnea, persistent cough wheezee and left sided chest pain described as heaviness, severe, radiates to her back, pt has been taking some medicines prescribed but her dyspnea and cough continues.  These symptoms have been present for about 4 weeks.  Her cough has been dry, denies fever.  She is finishing a Z-pack with last dose tomorrow.  Pt quit smoking a week or so ago.  20 pack year smoking history. Denies hemoptysis, h/o DVT, unilateral leg swelling, trauma, cancer treatment, or surgery.  Past Medical History  Diagnosis Date  . Hypertension   . Asthma     History reviewed. No pertinent past surgical history.  History reviewed. No pertinent family history.  History  Substance Use Topics  . Smoking status: Current Everyday Smoker  . Smokeless tobacco: Not on file  . Alcohol Use: No    OB History    Grav Para Term Preterm Abortions TAB SAB Ect Mult Living                  Review of SystemsPositive for cough, dyspnea, chest pain, has some sore throat with repeated coughing, ; Patient denies any fevers or chills, changes in vision, earache,  neck pain or stiffness, palpitations, syncope, abdominal pain, nausea, vomiting, diarrhea, melena, red bloody stools, frequency, dysuria, myalgias, arthralgias, back pain, recent trauma, rash, itching, skin lesions, easy bruising or bleeding, headache, seizures, numbness, tingling or weakness.    Allergies  Shellfish allergy  Home Medications   Current Outpatient Rx  Name Route Sig Dispense Refill  . ALBUTEROL SULFATE HFA 108 (90 BASE) MCG/ACT IN AERS  Inhalation Inhale 1 puff into the lungs every 6 (six) hours as needed. Shortness of breath    . AMLODIPINE-OLMESARTAN 5-20 MG PO TABS Oral Take 1 tablet by mouth daily.    . AZITHROMYCIN 250 MG PO TABS Oral Take 250 mg by mouth daily. Take 2 tabs at once today. Then take one tab daily for 4 days    . HYDROCODONE-HOMATROPINE 5-1.5 MG/5ML PO SYRP Oral Take 5 mLs by mouth every 12 (twelve) hours as needed. Cough    . IBUPROFEN 200 MG PO TABS Oral Take 200 mg by mouth every 6 (six) hours as needed. Pain    . ADULT MULTIVITAMIN W/MINERALS CH Oral Take 1 tablet by mouth daily.      BP 140/76  Pulse 86  Temp 99.4 F (37.4 C) (Oral)  Resp 18  SpO2 92%  LMP 02/23/2012  Physical Exam VITAL SIGNS:   Filed Vitals:   04/04/12 0000  BP: 134/76  Pulse: 109  Temp: 98.3 F (36.8 C)  Resp:    CONSTITUTIONAL: Awake, oriented, appears non-toxic HENT: Atraumatic, normocephalic, oral mucosa pink and moist, airway patent. Nares patent with thin clear drainage, turbinates mildly inflamed. External ears normal. EYES: Conjunctiva clear, EOMI, PERRLA NECK: Trachea midline, non-tender, supple CARDIOVASCULAR: Normal heart rate, Normal rhythm, No murmurs, rubs, gallops PULMONARY/CHEST: Decreased BS billaterally, end epiratory wheeze in bases, wheezes throughout in upper fields. Poor air movement.  Symmetrical breath sounds. Non-tender. ABDOMINAL: Non-distended, soft, non-tender - no rebound or guarding.  BS normal.  NEUROLOGIC: Non-focal, moving all four extremities, no gross sensory or motor deficits. EXTREMITIES: No clubbing, cyanosis, or edema SKIN: Warm, Dry, No erythema, No rash  ED Course  Procedures (including critical care time)  Labs Reviewed  CBC - Abnormal; Notable for the following:    WBC 13.0 (*)     RBC 5.18 (*)     All other components within normal limits  BASIC METABOLIC PANEL - Abnormal; Notable for the following:    Glucose, Bld 111 (*)     All other components within normal  limits  PRO B NATRIURETIC PEPTIDE  POCT I-STAT TROPONIN I   Dg Chest 2 View  04/03/2012  *RADIOLOGY REPORT*  Clinical Data: Chest pain, shortness of breath.  CHEST - 2 VIEW  Comparison: 03/26/2012  Findings: Lungs clear.  Heart size and pulmonary vascularity normal.  No effusion.  Visualized bones unremarkable.  IMPRESSION: No acute disease   Original Report Authenticated By: Osa Craver, M.D.      No diagnosis found.    MDM   KENDRA GRISSETT is a 46 y.o. female with presentation for 4 weeks of CP/dyspnea.  PERC rule doesn't apply due to initial O2 saturation, but using Well's Score, she has a 1 = low probability.  I doubt she has ACS also, her chest pain seems to be related to her dyspnea and cough.  On physical exam she is not moving much air.  She's been using her inhaler with spacer, but hasn't had a course of steroids raising the concern that she may be suffering from persistent bronchospasm.  Labs on protocol are all WNL.  CXR was read as normal - which I agree with, I would also add there is some flattening of the diaphragm and hyperinflation c/w obstructive lung disease.   Continuous nebs ordered - will re-evaluate. Prednisone started.   Pt moving much better air after 10mg  albuterol over an hour - she hasn't coughed since 10 minutes into the neb and is now breathing much better.  Wheezing in bases is much more pronounced and decreased wheezing in upper fields.  Discussed bronchospasm, smoking and further treatment including finishing Z-pack (to cover atypicals including B. Pert.) and will start prednisione.  Will give pt resource list to follow up with PCP or pulmonologist of her choice - made it very clear pt will require different controlloer medications over the long term and will need a long-term physician.  No dyspnea after therapy on ambulation.  I explained the diagnosis in detail and have given ER return precautions including worsening chest pain, shortness of  breath or any other new or worsening symptoms. The patient understands and accepts the medical plan as it's been dictated and I have answered all questions. Discharge instructions concerning home care and prescriptions have been given.  The patient is STABLE and is discharged to home in good condition.           Jones Skene, MD 04/07/12 2014

## 2012-04-03 NOTE — Progress Notes (Signed)
Peak flow was 120 before continuous nebulizer.

## 2012-04-03 NOTE — ED Notes (Signed)
Pt states she is being treated for bronchitis that she was diagnosed with here last week.  States two days ago began having left-sided chest pain described as heaviness radiating to mid back with sob, N/V, diaphoresis, and weakness.

## 2012-04-04 NOTE — ED Notes (Signed)
Ambulated pt down the hall--- tolerated well, O2 sat remains 90-91% during ambulation; pt denies SOB.

## 2012-04-16 ENCOUNTER — Other Ambulatory Visit (HOSPITAL_COMMUNITY): Payer: Self-pay | Admitting: Internal Medicine

## 2012-04-16 DIAGNOSIS — Z1231 Encounter for screening mammogram for malignant neoplasm of breast: Secondary | ICD-10-CM

## 2012-04-19 ENCOUNTER — Ambulatory Visit (HOSPITAL_COMMUNITY): Payer: 59

## 2012-04-22 ENCOUNTER — Ambulatory Visit (HOSPITAL_COMMUNITY)
Admission: RE | Admit: 2012-04-22 | Discharge: 2012-04-22 | Disposition: A | Discharge status: Home / Self Care | Payer: Self-pay | Source: Ambulatory Visit | Attending: Internal Medicine | Admitting: Internal Medicine

## 2012-04-22 DIAGNOSIS — Z1231 Encounter for screening mammogram for malignant neoplasm of breast: Secondary | ICD-10-CM

## 2012-11-20 ENCOUNTER — Encounter (HOSPITAL_COMMUNITY): Payer: Self-pay | Admitting: Pharmacist

## 2012-12-02 ENCOUNTER — Encounter (HOSPITAL_COMMUNITY): Payer: Self-pay

## 2012-12-02 ENCOUNTER — Encounter (HOSPITAL_COMMUNITY)
Admission: RE | Admit: 2012-12-02 | Discharge: 2012-12-02 | Disposition: A | Discharge status: Home / Self Care | Payer: PRIVATE HEALTH INSURANCE | Source: Ambulatory Visit | Attending: Obstetrics and Gynecology | Admitting: Obstetrics and Gynecology

## 2012-12-02 HISTORY — DX: Other specified postprocedural states: Z98.890

## 2012-12-02 HISTORY — DX: Nausea with vomiting, unspecified: R11.2

## 2012-12-02 LAB — CBC
Hemoglobin: 13.8 g/dL (ref 12.0–15.0)
MCH: 29.1 pg (ref 26.0–34.0)
Platelets: 388 10*3/uL (ref 150–400)
RBC: 4.74 MIL/uL (ref 3.87–5.11)

## 2012-12-02 LAB — BASIC METABOLIC PANEL
Calcium: 10 mg/dL (ref 8.4–10.5)
GFR calc Af Amer: >90 mL/min (ref >90)
GFR calc non Af Amer: >90 mL/min (ref >90)
Glucose, Bld: 114 mg/dL — ABNORMAL HIGH (ref 70–99)
Potassium: 3.5 mEq/L (ref 3.5–5.1)
Sodium: 138 mEq/L (ref 135–145)

## 2012-12-02 NOTE — Patient Instructions (Addendum)
Your procedure is scheduled on:12/13/12  Enter through the Main Entrance at : 1pm Pick up desk phone and dial [phone] and inform us of your arrival.  Please call [phone] if you have any problems the morning of surgery.  Remember: Do not eat after midnight:Thursday Clear liquids ok until 1030 am on Friday  Take these meds the morning of surgery with a sip of water: BP med  DO NOT wear jewelry, eye make-up, lipstick,body lotion, or dark fingernail polish.   If you are to be admitted after surgery, leave suitcase in car until your room has been assigned. Patients discharged on the day of surgery will not be allowed to drive home.

## 2012-12-05 ENCOUNTER — Other Ambulatory Visit: Payer: Self-pay | Admitting: Obstetrics and Gynecology

## 2012-12-13 ENCOUNTER — Ambulatory Visit (HOSPITAL_COMMUNITY): Payer: PRIVATE HEALTH INSURANCE | Admitting: Anesthesiology

## 2012-12-13 ENCOUNTER — Encounter (HOSPITAL_COMMUNITY): Payer: Self-pay | Admitting: Anesthesiology

## 2012-12-13 ENCOUNTER — Ambulatory Visit (HOSPITAL_COMMUNITY)
Admission: RE | Admit: 2012-12-13 | Discharge: 2012-12-13 | Disposition: A | Discharge status: Home / Self Care | Payer: PRIVATE HEALTH INSURANCE | Source: Ambulatory Visit | Attending: Obstetrics and Gynecology | Admitting: Obstetrics and Gynecology

## 2012-12-13 ENCOUNTER — Encounter (HOSPITAL_COMMUNITY)
Admission: RE | Disposition: A | Discharge status: Home / Self Care | Payer: Self-pay | Source: Ambulatory Visit | Attending: Obstetrics and Gynecology

## 2012-12-13 DIAGNOSIS — Z975 Presence of (intrauterine) contraceptive device: Secondary | ICD-10-CM | POA: Insufficient documentation

## 2012-12-13 DIAGNOSIS — D250 Submucous leiomyoma of uterus: Secondary | ICD-10-CM | POA: Insufficient documentation

## 2012-12-13 HISTORY — PX: HYSTEROSCOPY: SHX211

## 2012-12-13 SURGERY — HYSTEROSCOPY
Anesthesia: General | Site: Vagina | Wound class: Clean Contaminated

## 2012-12-13 MED ORDER — LACTATED RINGERS IV SOLN
INTRAVENOUS | Status: DC
  Administered 2012-12-13: 13:00:00 via INTRAVENOUS
  Administered 2012-12-13: 50 mL/h via INTRAVENOUS

## 2012-12-13 MED ORDER — PROPOFOL 10 MG/ML IV EMUL
INTRAVENOUS | Status: AC
  Filled 2012-12-13: qty 20

## 2012-12-13 MED ORDER — ONDANSETRON HCL 4 MG/2ML IJ SOLN
INTRAMUSCULAR | Status: AC
  Filled 2012-12-13: qty 2

## 2012-12-13 MED ORDER — MEPERIDINE HCL 25 MG/ML IJ SOLN: 6.2500 mg | INTRAMUSCULAR | Status: DC | PRN

## 2012-12-13 MED ORDER — LIDOCAINE HCL (CARDIAC) 20 MG/ML IV SOLN
INTRAVENOUS | Status: DC | PRN
  Administered 2012-12-13: 80 mg via INTRAVENOUS
  Administered 2012-12-13: 50 mg via INTRAVENOUS

## 2012-12-13 MED ORDER — LIDOCAINE HCL (CARDIAC) 20 MG/ML IV SOLN
INTRAVENOUS | Status: AC
  Filled 2012-12-13: qty 5

## 2012-12-13 MED ORDER — FENTANYL CITRATE 0.05 MG/ML IJ SOLN
25.0000 ug | INTRAMUSCULAR | Status: DC | PRN
  Administered 2012-12-13 (×3): 50 ug via INTRAVENOUS

## 2012-12-13 MED ORDER — FENTANYL CITRATE 0.05 MG/ML IJ SOLN
INTRAMUSCULAR | Status: AC
  Filled 2012-12-13: qty 2

## 2012-12-13 MED ORDER — DEXAMETHASONE SODIUM PHOSPHATE 10 MG/ML IJ SOLN
INTRAMUSCULAR | Status: AC
  Filled 2012-12-13: qty 1

## 2012-12-13 MED ORDER — CHLOROPROCAINE HCL 1 % IJ SOLN
INTRAMUSCULAR | Status: DC | PRN
  Administered 2012-12-13: 20 mL

## 2012-12-13 MED ORDER — FENTANYL CITRATE 0.05 MG/ML IJ SOLN
INTRAMUSCULAR | Status: DC | PRN
  Administered 2012-12-13: 50 ug via INTRAVENOUS

## 2012-12-13 MED ORDER — MIDAZOLAM HCL 5 MG/5ML IJ SOLN
INTRAMUSCULAR | Status: DC | PRN
  Administered 2012-12-13: 2 mg via INTRAVENOUS

## 2012-12-13 MED ORDER — ONDANSETRON HCL 4 MG/2ML IJ SOLN
4.0000 mg | Freq: Once | INTRAMUSCULAR | Status: AC | PRN
  Administered 2012-12-13: 4 mg via INTRAVENOUS

## 2012-12-13 MED ORDER — KETOROLAC TROMETHAMINE 30 MG/ML IJ SOLN
INTRAMUSCULAR | Status: AC
  Filled 2012-12-13: qty 1

## 2012-12-13 MED ORDER — KETOROLAC TROMETHAMINE 30 MG/ML IJ SOLN: 15.0000 mg | Freq: Once | INTRAMUSCULAR | Status: DC | PRN

## 2012-12-13 MED ORDER — ONDANSETRON HCL 4 MG/2ML IJ SOLN
INTRAMUSCULAR | Status: DC | PRN
  Administered 2012-12-13: 4 mg via INTRAVENOUS

## 2012-12-13 MED ORDER — PROPOFOL 10 MG/ML IV BOLUS
INTRAVENOUS | Status: DC | PRN
  Administered 2012-12-13: 180 mg via INTRAVENOUS

## 2012-12-13 MED ORDER — GLYCINE 1.5 % IR SOLN
Status: DC | PRN
  Administered 2012-12-13: 3000 mL

## 2012-12-13 MED ORDER — KETOROLAC TROMETHAMINE 30 MG/ML IJ SOLN
INTRAMUSCULAR | Status: DC | PRN
  Administered 2012-12-13: 30 mg via INTRAVENOUS

## 2012-12-13 MED ORDER — CHLOROPROCAINE HCL 1 % IJ SOLN
INTRAMUSCULAR | Status: AC
  Filled 2012-12-13: qty 30

## 2012-12-13 MED ORDER — LIDOCAINE HCL (CARDIAC) 20 MG/ML IV SOLN: INTRAVENOUS | Status: DC | PRN

## 2012-12-13 MED ORDER — DEXAMETHASONE SODIUM PHOSPHATE 10 MG/ML IJ SOLN
INTRAMUSCULAR | Status: DC | PRN
  Administered 2012-12-13: 10 mg via INTRAVENOUS

## 2012-12-13 MED ORDER — MIDAZOLAM HCL 2 MG/2ML IJ SOLN
INTRAMUSCULAR | Status: AC
  Filled 2012-12-13: qty 2

## 2012-12-13 SURGICAL SUPPLY — 16 items
CANISTER SUCTION 2500CC (MISCELLANEOUS) ×2 IMPLANT
CATH ROBINSON RED A/P 16FR (CATHETERS) ×2 IMPLANT
CLOTH BEACON ORANGE TIMEOUT ST (SAFETY) ×2 IMPLANT
CONTAINER PREFILL 10% NBF 60ML (FORM) ×4 IMPLANT
DRESSING TELFA 8X3 (GAUZE/BANDAGES/DRESSINGS) ×2 IMPLANT
ELECT REM PT RETURN 9FT ADLT (ELECTROSURGICAL) ×2
ELECTRODE REM PT RTRN 9FT ADLT (ELECTROSURGICAL) ×1 IMPLANT
GLOVE BIO SURGEON STRL SZ 6.5 (GLOVE) ×2 IMPLANT
GLOVE BIOGEL PI IND STRL 7.0 (GLOVE) ×1 IMPLANT
GLOVE BIOGEL PI INDICATOR 7.0 (GLOVE) ×1
GOWN STRL REIN XL XLG (GOWN DISPOSABLE) ×6 IMPLANT
LOOP ANGLED CUTTING 22FR (CUTTING LOOP) IMPLANT
PACK HYSTEROSCOPY LF (CUSTOM PROCEDURE TRAY) ×2 IMPLANT
PAD OB MATERNITY 4.3X12.25 (PERSONAL CARE ITEMS) ×2 IMPLANT
TOWEL OR 17X24 6PK STRL BLUE (TOWEL DISPOSABLE) ×4 IMPLANT
WATER STERILE IRR 1000ML POUR (IV SOLUTION) ×2 IMPLANT

## 2012-12-13 NOTE — Anesthesia Preprocedure Evaluation (Addendum)
Anesthesia Evaluation  Patient identified by MRN, date of birth, ID band Patient awake    Reviewed: Allergy & Precautions, H&P , NPO status , Patient's Chart, lab work & pertinent test results  Airway Mallampati: III TM Distance: >3 FB Neck ROM: full    Dental no notable dental hx. (+) Teeth Intact   Pulmonary    Pulmonary exam normal       Cardiovascular hypertension, Pt. on medications     Neuro/Psych negative neurological ROS  negative psych ROS   GI/Hepatic negative GI ROS, Neg liver ROS,   Endo/Other  negative endocrine ROSMorbid obesity  Renal/GU negative Renal ROS  negative genitourinary   Musculoskeletal   Abdominal (+) + obese,   Peds negative pediatric ROS (+)  Hematology negative hematology ROS (+)   Anesthesia Other Findings   Reproductive/Obstetrics negative OB ROS                          Anesthesia Physical Anesthesia Plan  ASA: III  Anesthesia Plan: General   Post-op Pain Management:    Induction: Intravenous  Airway Management Planned: LMA  Additional Equipment:   Intra-op Plan:   Post-operative Plan:   Informed Consent: I have reviewed the patients History and Physical, chart, labs and discussed the procedure including the risks, benefits and alternatives for the proposed anesthesia with the patient or authorized representative who has indicated his/her understanding and acceptance.     Plan Discussed with: CRNA, Surgeon and Anesthesiologist  Anesthesia Plan Comments:        Anesthesia Quick Evaluation

## 2012-12-13 NOTE — Anesthesia Procedure Notes (Signed)
Procedure Name: LMA Insertion Date/Time: 12/13/2012 2:46 PM Performed by: Graciela Husbands Pre-anesthesia Checklist: Suction available, Emergency Drugs available, Timeout performed, Patient being monitored and Patient identified Oxygen Delivery Method: Circle system utilized Preoxygenation: Pre-oxygenation with 100% oxygen Intubation Type: IV induction LMA: LMA inserted LMA Size: 4.0 Number of attempts: 1 Placement Confirmation: positive ETCO2 and breath sounds checked- equal and bilateral Tube secured with: Tape Dental Injury: Teeth and Oropharynx as per pre-operative assessment

## 2012-12-13 NOTE — Anesthesia Postprocedure Evaluation (Signed)
Anesthesia Post Note  Patient: [name]  Procedure(s) Performed: Procedure(s) (LRB): HYSTEROSCOPY/IUD REMOVAL (N/A)  Anesthesia type: GA  Patient location: PACU  Post pain: Pain level controlled  Post assessment: Post-op Vital signs reviewed  Last Vitals:  Filed Vitals:   12/13/12 1254  BP: 133/68  Pulse: 80  Temp: 36.9 C  Resp: 18    Post vital signs: Reviewed  Level of consciousness: sedated  Complications: No apparent anesthesia complications

## 2012-12-13 NOTE — Brief Op Note (Signed)
12/13/2012  3:48 PM  PATIENT:  [name]  47 y.o. female  PRE-OPERATIVE DIAGNOSIS:  Retained IUD   POST-OPERATIVE DIAGNOSIS:  retained IUD, submucosal fibroid  PROCEDURE:  Dx hysteroscopy, removal of IUD  SURGEON:  Surgeon(s) and Role:    * Natsha Guidry Cathie Beams, MD - Primary  PHYSICIAN ASSISTANT:   ASSISTANTS: none   ANESTHESIA:   paracervical block and MAC Findings: IUD string not seen. IUD w/in cavity. 2 small sm fibroid noted( post left and ant right), tubal ostia seen  EBL:  Total I/O In: 1000 [I.V.:1000] Out: -   BLOOD ADMINISTERED:none  DRAINS: none   LOCAL MEDICATIONS USED:  OTHER nesicaine  SPECIMEN:  No Specimen  DISPOSITION OF SPECIMEN:  N/A  COUNTS:  YES  TOURNIQUET:  * No tourniquets in log *  DICTATION: .Other Dictation: Dictation Number [id number]  PLAN OF CARE: Discharge to home after PACU  PATIENT DISPOSITION:  PACU - hemodynamically stable.   Delay start of Pharmacological VTE agent (>24hrs) due to surgical blood loss or risk of bleeding: no

## 2012-12-13 NOTE — Transfer of Care (Signed)
Immediate Anesthesia Transfer of Care Note  Patient: [name]  Procedure(s) Performed: Procedure(s): HYSTEROSCOPY/IUD REMOVAL (N/A)  Patient Location: PACU  Anesthesia Type:General  Level of Consciousness: awake, alert  and oriented  Airway & Oxygen Therapy: Patient Spontanous Breathing and Patient connected to nasal cannula oxygen  Post-op Assessment: Report given to PACU RN and Post -op Vital signs reviewed and stable  Post vital signs: stable  Complications: No apparent anesthesia complications

## 2012-12-14 NOTE — Op Note (Signed)
NAMEWYNEMA, GAROUTTE            ACCOUNT NO.:  [id number]  MEDICAL RECORD NO.:  [id number]  LOCATION:  [facility]                          FACILITY:  [facility]  PHYSICIAN:  Maxie Better, M.D.DATE OF BIRTH:  01-Sep-1965  DATE OF PROCEDURE:  12/13/2012 DATE OF DISCHARGE:  12/13/2012                              OPERATIVE REPORT   PREOPERATIVE DIAGNOSIS:  Retained intrauterine device.  POSTOPERATIVE DIAGNOSIS:  Retained intrauterine device, submucosal fibroid.  PROCEDURE:  Diagnostic hysteroscopy, removal of IUD.  ANESTHESIA:  MAC, paracervical block.  SURGEON:  Maxie Better, MD  ASSISTANT:  None.  PROCEDURE IN DETAIL:  Under adequate monitored anesthesia, the patient was placed in the dorsal lithotomy position.  Examination under anesthesia revealed a retroverted uterus.  No adnexal masses could be appreciated.  A bivalve speculum was placed in the vagina.  The IUD string was not seen and anterior lip of the cervix was grasped with a single-tooth tenaculum.  20 mL of 1% Nesacaine was injected paracervically at 3 and 9 o'clock position.  The cervix was then carefully dilated and a hysteroscope was introduced into the uterine cavity.  The IUD was seen.  The hysteroscope was removed. A polyp forceps was inserted and after manipulation, the IUD was actually removed.  The hysteroscope was then reinserted.  The uterine cavity was inspected.  Two small submucosal fibroids were noted.  The patient, however, was not consented for resection of any fibroid or any other findings and therefore, the hysteroscope was removed.  All instruments were then removed from the vagina.  The procedure was felt to be completed. Specimen: IUD not sent to pathology.  Estimated blood loss was minimal.  Complications none.  The patient tolerated the procedure well, was transferred to recovery in stable condition.     Maxie Better, M.D.     [place]/MEDQ  D:  12/13/2012  T:  12/14/2012  Job:   [id number]

## 2012-12-16 ENCOUNTER — Encounter (HOSPITAL_COMMUNITY): Payer: Self-pay | Admitting: Obstetrics and Gynecology

## 2013-03-25 ENCOUNTER — Other Ambulatory Visit (HOSPITAL_COMMUNITY): Payer: Self-pay | Admitting: Internal Medicine

## 2013-03-25 DIAGNOSIS — Z1231 Encounter for screening mammogram for malignant neoplasm of breast: Secondary | ICD-10-CM

## 2013-04-25 ENCOUNTER — Ambulatory Visit (HOSPITAL_COMMUNITY)
Admission: RE | Admit: 2013-04-25 | Discharge: 2013-04-25 | Disposition: A | Discharge status: Home / Self Care | Payer: PRIVATE HEALTH INSURANCE | Source: Ambulatory Visit | Attending: Internal Medicine | Admitting: Internal Medicine

## 2013-04-25 DIAGNOSIS — Z1231 Encounter for screening mammogram for malignant neoplasm of breast: Secondary | ICD-10-CM | POA: Insufficient documentation

## 2013-11-12 ENCOUNTER — Other Ambulatory Visit: Payer: Self-pay | Admitting: Internal Medicine

## 2013-11-12 DIAGNOSIS — N6320 Unspecified lump in the left breast, unspecified quadrant: Principal | ICD-10-CM

## 2013-11-12 DIAGNOSIS — N6325 Unspecified lump in the left breast, overlapping quadrants: Secondary | ICD-10-CM

## 2013-11-21 ENCOUNTER — Ambulatory Visit
Admission: RE | Admit: 2013-11-21 | Discharge: 2013-11-21 | Disposition: A | Discharge status: Home / Self Care | Payer: 59 | Source: Ambulatory Visit | Attending: Internal Medicine | Admitting: Internal Medicine

## 2013-11-21 DIAGNOSIS — N6325 Unspecified lump in the left breast, overlapping quadrants: Secondary | ICD-10-CM

## 2013-11-21 DIAGNOSIS — N6320 Unspecified lump in the left breast, unspecified quadrant: Principal | ICD-10-CM

## 2013-12-01 ENCOUNTER — Emergency Department (INDEPENDENT_AMBULATORY_CARE_PROVIDER_SITE_OTHER)
Admission: EM | Admit: 2013-12-01 | Discharge: 2013-12-01 | Disposition: A | Discharge status: Home / Self Care | Payer: 59 | Source: Home / Self Care | Attending: Emergency Medicine | Admitting: Emergency Medicine

## 2013-12-01 ENCOUNTER — Encounter (HOSPITAL_COMMUNITY): Payer: Self-pay | Admitting: Emergency Medicine

## 2013-12-01 DIAGNOSIS — J45901 Unspecified asthma with (acute) exacerbation: Secondary | ICD-10-CM

## 2013-12-01 MED ORDER — BECLOMETHASONE DIPROPIONATE 80 MCG/ACT IN AERS: 2.0000 | INHALATION_SPRAY | Freq: Two times a day (BID) | RESPIRATORY_TRACT | Status: DC

## 2013-12-01 MED ORDER — ALBUTEROL SULFATE (2.5 MG/3ML) 0.083% IN NEBU
INHALATION_SOLUTION | RESPIRATORY_TRACT | Status: AC
  Filled 2013-12-01: qty 6

## 2013-12-01 MED ORDER — IPRATROPIUM-ALBUTEROL 0.5-2.5 (3) MG/3ML IN SOLN
3.0000 mL | Freq: Once | RESPIRATORY_TRACT | Status: AC
  Administered 2013-12-01: 3 mL via RESPIRATORY_TRACT

## 2013-12-01 MED ORDER — METHYLPREDNISOLONE ACETATE 80 MG/ML IJ SUSP
80.0000 mg | Freq: Once | INTRAMUSCULAR | Status: AC
  Administered 2013-12-01: 80 mg via INTRAMUSCULAR

## 2013-12-01 MED ORDER — IPRATROPIUM BROMIDE 0.02 % IN SOLN
RESPIRATORY_TRACT | Status: AC
  Filled 2013-12-01: qty 2.5

## 2013-12-01 MED ORDER — METHYLPREDNISOLONE ACETATE 80 MG/ML IJ SUSP
INTRAMUSCULAR | Status: AC
  Filled 2013-12-01: qty 1

## 2013-12-01 MED ORDER — ALBUTEROL SULFATE HFA 108 (90 BASE) MCG/ACT IN AERS
2.0000 | INHALATION_SPRAY | RESPIRATORY_TRACT | Status: DC | PRN
Start: 2013-12-01 — End: 2018-06-04

## 2013-12-01 MED ORDER — ALBUTEROL SULFATE (2.5 MG/3ML) 0.083% IN NEBU
5.0000 mg | INHALATION_SOLUTION | Freq: Once | RESPIRATORY_TRACT | Status: AC
  Administered 2013-12-01: 5 mg via RESPIRATORY_TRACT

## 2013-12-01 MED ORDER — PREDNISONE 20 MG PO TABS
ORAL_TABLET | ORAL | Status: DC
Start: 2013-12-01 — End: 2018-04-11

## 2013-12-01 NOTE — ED Notes (Addendum)
History of asthma, has had cough , wheezing x 1 week w some chest tightness. Light color to sceretions.had Rx for prednisone last week, NP called in montelukast and loratadine late last week. . Able to speak in complete sentences , NAD  Dr Jake Michaelis advised of patient condition

## 2013-12-01 NOTE — ED Provider Notes (Signed)
Chief Complaint   Chief Complaint  Patient presents with  . URI    History of Present Illness   [name] is a 48 year old female with asthma who has had a six day history of cough productive of small amounts of clear sputum, chest tightness, wheezing, aching her chest, scratchy throat, and nasal congestion. She has a history of asthma since she was a child. She was hospitalized several times in childhood for asthma but none since that. She was given a course of prednisone about three weeks ago for her asthma. She also has begun allergy medicines including loratadine and montelukast. She has an albuterol inhaler at home. Right now she's having symptoms both at rest and with exertion. She has symptoms daytime and nighttime. Sometimes this wakes her up at night. She only has had one round of prednisone of the past year and no emergency room visits for her asthma.  Review of Systems   Other than as noted above, the patient denies any of the following symptoms. Systemic:  No fever, chills, or sweats. ENT:  No nasal congestion, sneezing, rhinorrhea, or sore throat. Lungs:  No cough, sputum production, or shortness of breath. No chest pain. Skin:  No rash or itching.  [place]   Past medical history, family history, social history, meds, and allergies were reviewed.   Physical Examination    Vital signs:  BP 134/71  Pulse 76  Temp(Src) 97.9 F (36.6 C) (Oral)  Resp 14  SpO2 100% General:  Alert, in no distress. Able to speak in full sentences. Eye:  No conjunctival injection or drainage. Lids were normal. ENT:  TMs and canals were normal, without erythema or inflammation.  Nasal mucosa was clear and uncongested, without drainage.  Mucous membranes were moist.  Pharynx was clear, without exudate or drainage.  There were no oral ulcerations or lesions. Neck:  Supple, no adenopathy, tenderness or mass. Lungs:  No retractions or use of accessory muscles.  No respiratory distress.   She has bilateral expiratory wheezes, no rales or rhonchi. Heart:  Regular rhythm, without gallops, murmers or rubs. Skin:  Clear, warm, and dry, without rash or lesions.  Course in Urgent [place]   She was given a DuoNeb breathing treatment and Depo-Medrol 80 mg IM. Thereafter she felt a lot better and her lungs were completely clear and wheeze free.  Assessment   The encounter diagnosis was Asthma attack.  Plan    1.  Meds:  The following meds were prescribed:   Discharge Medication List as of 12/01/2013 12:16 PM    START taking these medications   Details  albuterol (PROVENTIL HFA;VENTOLIN HFA) 108 (90 BASE) MCG/ACT inhaler Inhale 2 puffs into the lungs every 4 (four) hours as needed for wheezing or shortness of breath., Starting 12/01/2013, Until Discontinued, Normal    beclomethasone (QVAR) 80 MCG/ACT inhaler Inhale 2 puffs into the lungs 2 (two) times daily., Starting 12/01/2013, Until Discontinued, Normal    predniSONE (DELTASONE) 20 MG tablet Take 3 daily for 5 days, 2 daily for 5 days, 1 daily for 5 days., Normal        2.  Patient Education/Counseling:  The patient was given appropriate handouts, self care instructions, and instructed in symptomatic relief.    3.  Follow up:  The patient was told to follow up here if no better in 2 days, or sooner if becoming worse in any way, and given some red flag symptoms such as increasing difficulty breathing which would prompt  immediate return.  Follow up with her primary care physician in two weeks.       Harden Mo, MD 12/01/13 [phone]

## 2013-12-01 NOTE — Discharge Instructions (Signed)
People who suffer from allergies frequently have symptoms of nasal congestion, runny nose, sneezing, itching of the nose, eyes, ears or throat, mucous in the throat, watering of the eyes and cough.  These symptoms are caused by the body's immune response to environmental allergens.  For seasonal allergies this is pollen (tree pollen in the spring, grass pollen in the summer, and weed pollen in the fall).  Year round allergy symptoms are usually caused by dust or mould.  Many people have year round symptoms which are worse seasonally. ° °For people who have seasonal allergies, pollen avoidance may help to decease symptoms.  This means keeping windows in the house down and windows in the car up.  Run your air conditioning, since this filters out many of the pollen particles.  If you have to spend a prolonged time outdoors during heavy pollen season, it might be prudent to wear a mask.  These can be purchased at any drug store.  When you come in after heavy pollen exposure, your skin, clothing and hair are covered with pollen.  Changing your clothing, taking a shower, and washing your hair may help with your pollen exposure.  Also, your bedding, pillow, and pillowcase may become contaminated with pollen, so frequent washing of your bedding and pillowcase and changing out your pillow may help as well.  (Your pillow can also be a source of dust and mould exposure as well.)  Showering at bedtime may also help. ° °During heavy pollen season (April and September), a large amount of pollen gets trapped in your nasal cavity.  This can contribute to ongoing allergy symptoms.  Saline irrigation of the nasal cavity can help to remove this and relieve allergy symptoms.  This can be accomplished in several ways.  You can mix up your own saline solution using the following recipe:  8 oz of distilled or boiled water, 1/2 tsp of table salt (sodium choride), and a pinch of baking soda (sodium bicarbonate).  If nasal congestion is a  problem.  1 to 2 drops of Afrin solution can be added to this as well.  To do the irrigation, purchase a nasal bulb syringe (the kind you would use to clean out an infant's nose).  Fill this up with the solution, lean you head over a sink with the nostril to be irrigated turned upward, insert the syringe into your nostril, making a tight seal, and gently irrigate, compressing the bulb.  The solution will flow into your nostril and out the other, some may also come out of your mouth.  Repeat this on both sides.  You can do this once daily.  Do not store the solution, mix it up fresh each day.  A commercial solution, called Neomed Solution, can be purchased over the counter without prescription.  You can also use a Netti Pot for irrigation.  These can be purchased at your drug store as well.  Be sure to use distilled or boiled water in these as well and make sure the Netti pot is completely dry between uses. ° °Over the counter medications can be helpful, and in many cases can completely control allergy symptoms without resorting to more expensive prescription meds.   °Antihistamines are the mainstay of allergy treatment.  The newer non-sedating antihistamines are all available over the counter.  These include Allegra, Zyrtec, and Claritin which also can be purchased in their generic forms: fexofenadine, cetirizine, and  Cetirizine.  Combining these meds with a decongestant such as pseudoephedrine or   phenylephrine helps with nasal congestion, but decongestants can also cause elevations in blood pressure.  Pseudoephedrine tends to be more effective than phenylephrine.  The older, more sedating antihistamines such as chlorpheniramine, brompheniramine, and diphenhydramine are also very effective, sometimes more so than the newer antihistamines, but with the price of more sedation.  You should be careful about driving or operating heavy machinery when taking sedating antihistamines, and men with enlarged prostates may  experience urinary retention with diphenhydramine.  Naslacrom nasal spray can be very effective for allergy symptoms.  It is available over the counter and has very few side effects.  The dosage is 2 sprays in each nostril twice daily.  It is recommended that you pinch your nose shut for 30 seconds after using it since it is a watery spray and can run out.  It can be used as long as needed.  There is no risk of dependency.  For people with year round allergies, dust, mould, insect emanations, and pet dander are usually the culprits.  To avoid dust, you need to avoid dust mites which are the main source of allergens in house dust.  Cover your bedding with moisture and mite impervious covers.  These can be purchased at any mattress store.  The modern covers are a little expensive, but not at all uncomfortable. Keeping your house as dry as possible will also help to control dust mites.  Do not use a humidifier and it may help to use a dehumidifier.  Use of a HEPA filter air filter is also a great way to reduce dust and mold exposure.  These units can be purchased commercially.  Make sure to buy one large enough for the room you intend to use it.  Change the filter as per the manufacturer's instructions.  Also, using a HEPA filter vacuum for your carpets is helpful.  There are chemicals that you can sprinkle on your carpet called acaricides that will kill dist mites.  The most commonly used brand is Acarosan.  This can be purchased on line.  It does have to be periodically reapplied.  Wash you pillows and bedsheets regularly in hot water.         Asthma Attack Prevention Although there is no way to prevent asthma from starting, you can take steps to control the disease and reduce its symptoms. Learn about your asthma and how to control it. Take an active role to control your asthma by working with your health care provider to create and follow an asthma action plan. An asthma action plan guides you  in:  Taking your medicines properly.  Avoiding things that set off your asthma or make your asthma worse (asthma triggers).  Tracking your level of asthma control.  Responding to worsening asthma.  Seeking emergency care when needed. To track your asthma, keep records of your symptoms, check your peak flow number using a handheld device that shows how well air moves out of your lungs (peak flow meter), and get regular asthma checkups.  WHAT ARE SOME WAYS TO PREVENT AN ASTHMA ATTACK?  Take medicines as directed by your health care provider.  Keep track of your asthma symptoms and level of control.  With your health care provider, write a detailed plan for taking medicines and managing an asthma attack. Then be sure to follow your action plan. Asthma is an ongoing condition that needs regular monitoring and treatment.  Identify and avoid asthma triggers. Many outdoor allergens and irritants (such as pollen, mold, cold  air, and air pollution) can trigger asthma attacks. Find out what your asthma triggers are and take steps to avoid them.  Monitor your breathing. Learn to recognize warning signs of an attack, such as coughing, wheezing, or shortness of breath. Your lung function may decrease before you notice any signs or symptoms, so regularly measure and record your peak airflow with a home peak flow meter.  Identify and treat attacks early. If you act quickly, you are less likely to have a severe attack. You will also need less medicine to control your symptoms. When your peak flow measurements decrease and alert you to an upcoming attack, take your medicine as instructed and immediately stop any activity that may have triggered the attack. If your symptoms do not improve, get medical help.  Pay attention to increasing quick-relief inhaler use. If you find yourself relying on your quick-relief inhaler, your asthma is not under control. See your health care provider about adjusting your  treatment. WHAT CAN MAKE MY SYMPTOMS WORSE? A number of common things can set off or make your asthma symptoms worse and cause temporary increased inflammation of your airways. Keep track of your asthma symptoms for several weeks, detailing all the environmental and emotional factors that are linked with your asthma. When you have an asthma attack, go back to your asthma diary to see which factor, or combination of factors, might have contributed to it. Once you know what these factors are, you can take steps to control many of them. If you have allergies and asthma, it is important to take asthma prevention steps at home. Minimizing contact with the substance to which you are allergic will help prevent an asthma attack. Some triggers and ways to avoid these triggers are: Animal Dander:  Some people are allergic to the flakes of skin or dried saliva from animals with fur or feathers.   There is no such thing as a hypoallergenic dog or cat breed. All dogs or cats can cause allergies, even if they don't shed.  Keep these pets out of your home.  If you are not able to keep a pet outdoors, keep the pet out of your bedroom and other sleeping areas at all times, and keep the door closed.  Remove carpets and furniture covered with cloth from your home. If that is not possible, keep the pet away from fabric-covered furniture and carpets. Dust Mites: Many people with asthma are allergic to dust mites. Dust mites are tiny bugs that are found in every home in mattresses, pillows, carpets, fabric-covered furniture, bedcovers, clothes, stuffed toys, and other fabric-covered items.   Cover your mattress in a special dust-proof cover.  Cover your pillow in a special dust-proof cover, or wash the pillow each week in hot water. Water must be hotter than 130 F (54.4 C) to kill dust mites. Cold or warm water used with detergent and bleach can also be effective.  Wash the sheets and blankets on your bed each week  in hot water.  Try not to sleep or lie on cloth-covered cushions.  Call ahead when traveling and ask for a smoke-free hotel room. Bring your own bedding and pillows in case the hotel only supplies feather pillows and down comforters, which may contain dust mites and cause asthma symptoms.  Remove carpets from your bedroom and those laid on concrete, if you can.  Keep stuffed toys out of the bed, or wash the toys weekly in hot water or cooler water with detergent and bleach. Cockroaches:  Many people with asthma are allergic to the droppings and remains of cockroaches.   Keep food and garbage in closed containers. Never leave food out.  Use poison baits, traps, powders, gels, or paste (for example, boric acid).  If a spray is used to kill cockroaches, stay out of the room until the odor goes away. Indoor Mold:  Fix leaky faucets, pipes, or other sources of water that have mold around them.  Clean floors and moldy surfaces with a fungicide or diluted bleach.  Avoid using humidifiers, vaporizers, or swamp coolers. These can spread molds through the air. Pollen and Outdoor Mold:  When pollen or mold spore counts are high, try to keep your windows closed.  Stay indoors with windows closed from late morning to afternoon. Pollen and some mold spore counts are highest at that time.  Ask your health care provider whether you need to take anti-inflammatory medicine or increase your dose of the medicine before your allergy season starts. Other Irritants to Avoid:  Tobacco smoke is an irritant. If you smoke, ask your health care provider how you can quit. Ask family members to quit smoking too. Do not allow smoking in your home or car.  If possible, do not use a wood-burning stove, kerosene heater, or fireplace. Minimize exposure to all sources of smoke, including to incense, candles, fires, and fireworks.  Try to stay away from strong odors and sprays, such as perfume, talcum powder, hair  spray, and paints.  Decrease humidity in your home and use an indoor air cleaning device. Reduce indoor humidity to below 60%. Dehumidifiers or central air conditioners can do this.  Decrease house dust exposure by changing furnace and air cooler filters frequently.  Try to have someone else vacuum for you once or twice a week. Stay out of rooms while they are being vacuumed and for a short while afterward.  If you vacuum, use a dust mask from a hardware store, a double-layered or microfilter vacuum cleaner bag, or a vacuum cleaner with a HEPA filter.  Sulfites in foods and beverages can be irritants. Do not drink beer or wine or eat dried fruit, processed potatoes, or shrimp if they cause asthma symptoms.  Cold air can trigger an asthma attack. Cover your nose and mouth with a scarf on cold or windy days.  Several health conditions can make asthma more difficult to manage, including a runny nose, sinus infections, reflux disease, psychological stress, and sleep apnea. Work with your health care provider to manage these conditions.  Avoid close contact with people who have a respiratory infection such as a cold or the flu, since your asthma symptoms may get worse if you catch the infection. Wash your hands thoroughly after touching items that may have been handled by people with a respiratory infection.  Get a flu shot every year to protect against the flu virus, which often makes asthma worse for days or weeks. Also get a pneumonia shot if you have not previously had one. Unlike the flu shot, the pneumonia shot does not need to be given yearly. Medicines:  Talk to your health care provider about whether it is safe for you to take aspirin or non-steroidal anti-inflammatory medicines (NSAIDs). In a small number of people with asthma, aspirin and NSAIDs can cause asthma attacks. These medicines must be avoided by people who have known aspirin-sensitive asthma. It is important that people with  aspirin-sensitive asthma read labels of all over-the-counter medicines used to treat pain, colds, coughs, and  fever.  Beta blockers and ACE inhibitors are other medicines you should discuss with your health care provider. HOW CAN I FIND OUT WHAT I AM ALLERGIC TO? Ask your asthma health care provider about allergy skin testing or blood testing (the RAST test) to identify the allergens to which you are sensitive. If you are found to have allergies, the most important thing to do is to try to avoid exposure to any allergens that you are sensitive to as much as possible. Other treatments for allergies, such as medicines and allergy shots (immunotherapy) are available.  CAN I EXERCISE? Follow your health care provider's advice regarding asthma treatment before exercising. It is important to maintain a regular exercise program, but vigorous exercise, or exercise in cold, humid, or dry environments can cause asthma attacks, especially for those people who have exercise-induced asthma. Document Released: 07/12/2009 Document Revised: 03/26/2013 Document Reviewed: 01/29/2013 [facility] Patient Information 2014 [place].

## 2013-12-23 ENCOUNTER — Other Ambulatory Visit: Payer: Self-pay | Admitting: Family

## 2013-12-23 ENCOUNTER — Ambulatory Visit
Admission: RE | Admit: 2013-12-23 | Discharge: 2013-12-23 | Disposition: A | Discharge status: Home / Self Care | Payer: 59 | Source: Ambulatory Visit | Attending: Family | Admitting: Family

## 2013-12-23 DIAGNOSIS — R058 Other specified cough: Secondary | ICD-10-CM

## 2013-12-23 DIAGNOSIS — R079 Chest pain, unspecified: Secondary | ICD-10-CM

## 2013-12-23 DIAGNOSIS — R0602 Shortness of breath: Secondary | ICD-10-CM

## 2013-12-23 DIAGNOSIS — R05 Cough: Secondary | ICD-10-CM

## 2014-03-23 ENCOUNTER — Other Ambulatory Visit: Payer: Self-pay

## 2014-03-23 DIAGNOSIS — Z1231 Encounter for screening mammogram for malignant neoplasm of breast: Secondary | ICD-10-CM

## 2014-04-20 ENCOUNTER — Other Ambulatory Visit (HOSPITAL_COMMUNITY): Payer: Self-pay | Admitting: Internal Medicine

## 2014-04-20 DIAGNOSIS — Z1231 Encounter for screening mammogram for malignant neoplasm of breast: Secondary | ICD-10-CM

## 2014-04-27 ENCOUNTER — Ambulatory Visit: Payer: 59

## 2014-04-28 ENCOUNTER — Ambulatory Visit (HOSPITAL_COMMUNITY)
Admission: RE | Admit: 2014-04-28 | Discharge: 2014-04-28 | Disposition: A | Discharge status: Home / Self Care | Payer: BC Managed Care – PPO | Source: Ambulatory Visit | Attending: Internal Medicine | Admitting: Internal Medicine

## 2014-04-28 DIAGNOSIS — Z1231 Encounter for screening mammogram for malignant neoplasm of breast: Secondary | ICD-10-CM | POA: Diagnosis present

## 2015-03-20 ENCOUNTER — Emergency Department (HOSPITAL_COMMUNITY)
Admission: EM | Admit: 2015-03-20 | Discharge: 2015-03-20 | Disposition: A | Discharge status: Home / Self Care | Payer: 59 | Attending: Emergency Medicine | Admitting: Emergency Medicine

## 2015-03-20 ENCOUNTER — Encounter (HOSPITAL_COMMUNITY): Payer: Self-pay | Admitting: Emergency Medicine

## 2015-03-20 ENCOUNTER — Emergency Department (HOSPITAL_COMMUNITY): Payer: 59

## 2015-03-20 DIAGNOSIS — Z79899 Other long term (current) drug therapy: Secondary | ICD-10-CM | POA: Insufficient documentation

## 2015-03-20 DIAGNOSIS — W010XXA Fall on same level from slipping, tripping and stumbling without subsequent striking against object, initial encounter: Secondary | ICD-10-CM | POA: Insufficient documentation

## 2015-03-20 DIAGNOSIS — M545 Low back pain, unspecified: Secondary | ICD-10-CM

## 2015-03-20 DIAGNOSIS — S8002XA Contusion of left knee, initial encounter: Secondary | ICD-10-CM

## 2015-03-20 DIAGNOSIS — I10 Essential (primary) hypertension: Secondary | ICD-10-CM | POA: Insufficient documentation

## 2015-03-20 DIAGNOSIS — J45909 Unspecified asthma, uncomplicated: Secondary | ICD-10-CM | POA: Insufficient documentation

## 2015-03-20 DIAGNOSIS — S3992XA Unspecified injury of lower back, initial encounter: Secondary | ICD-10-CM | POA: Insufficient documentation

## 2015-03-20 DIAGNOSIS — S8392XA Sprain of unspecified site of left knee, initial encounter: Secondary | ICD-10-CM | POA: Insufficient documentation

## 2015-03-20 DIAGNOSIS — Z87891 Personal history of nicotine dependence: Secondary | ICD-10-CM | POA: Insufficient documentation

## 2015-03-20 DIAGNOSIS — S86912A Strain of unspecified muscle(s) and tendon(s) at lower leg level, left leg, initial encounter: Secondary | ICD-10-CM | POA: Insufficient documentation

## 2015-03-20 DIAGNOSIS — Z7951 Long term (current) use of inhaled steroids: Secondary | ICD-10-CM | POA: Insufficient documentation

## 2015-03-20 DIAGNOSIS — Y9389 Activity, other specified: Secondary | ICD-10-CM | POA: Insufficient documentation

## 2015-03-20 DIAGNOSIS — W19XXXA Unspecified fall, initial encounter: Secondary | ICD-10-CM

## 2015-03-20 DIAGNOSIS — M47816 Spondylosis without myelopathy or radiculopathy, lumbar region: Secondary | ICD-10-CM

## 2015-03-20 DIAGNOSIS — Y9289 Other specified places as the place of occurrence of the external cause: Secondary | ICD-10-CM | POA: Insufficient documentation

## 2015-03-20 DIAGNOSIS — Y998 Other external cause status: Secondary | ICD-10-CM | POA: Insufficient documentation

## 2015-03-20 DIAGNOSIS — M47896 Other spondylosis, lumbar region: Secondary | ICD-10-CM | POA: Insufficient documentation

## 2015-03-20 MED ORDER — HYDROCODONE-ACETAMINOPHEN 5-325 MG PO TABS: 1.0000 | ORAL_TABLET | Freq: Four times a day (QID) | ORAL | Status: DC | PRN

## 2015-03-20 MED ORDER — NAPROXEN 500 MG PO TABS: 500.0000 mg | ORAL_TABLET | Freq: Two times a day (BID) | ORAL | Status: DC | PRN

## 2015-03-20 NOTE — ED Notes (Signed)
Patient reports slip and fall at [employer] today. Complains of left knee pain, back pain.

## 2015-03-20 NOTE — Discharge Instructions (Signed)
Knee pain: Wear knee sleeve for at least 2 weeks for stabilization of knee. Use crutches as needed for comfort. Ice and elevate knee throughout the day. Alternate between naprosyn and norco for pain relief. Do not drive or operate machinery with pain medication use. Call orthopedic follow up today or tomorrow to schedule followup appointment for recheck of ongoing knee pain in one to two weeks that can be canceled with a 24-48 hour notice if complete resolution of pain. Return to the ER for changes or worsening symptoms.  Back Pain: Your back pain should be treated with medicines such as ibuprofen or aleve and this back pain should get better over the next 2 weeks.  However if you develop severe or worsening pain, low back pain with fever, numbness, weakness or inability to walk or urinate, you should return to the ER immediately.  Please follow up with your doctor this week for a recheck if still having symptoms. You have some arthritis in your low back which could be causing some of your pain after the fall.  Low back pain is discomfort in the lower back that may be due to injuries to muscles and ligaments around the spine.  Occasionally, it may be caused by a a problem to a part of the spine called a disc.  The pain may last several days or a week;  However, most patients get completely well in 4 weeks.  Self - care:  The application of heat can help soothe the pain.  Maintaining your daily activities, including walking, is encourged, as it will help you get better faster than just staying in bed. Perform gentle stretching as discussed. Drink plenty of fluids.  Medications are also useful to help with pain control.  A commonly prescribed medication includes norco.  Do not drive or operate heavy machinery while taking this medication.  Non steroidal anti inflammatory medications including Ibuprofen and naproxen;  These medications help both pain and swelling and are very useful in treating back pain.   They should be taken with food, as they can cause stomach upset, and more seriously, stomach bleeding.    SEEK IMMEDIATE MEDICAL ATTENTION IF: New numbness, tingling, weakness, or problem with the use of your arms or legs.  Severe back pain not relieved with medications.  Difficulty with or loss of control of your bowel or bladder control.  Increasing pain in any areas of the body (such as chest or abdominal pain).  Shortness of breath, dizziness or fainting.  Nausea (feeling sick to your stomach), vomiting, fever, or sweats.  You will need to follow up with  Your primary healthcare provider in 1-2 weeks for reassessment.   Back Pain, Adult Back pain is very common. The pain often gets better over time. The cause of back pain is usually not dangerous. Most people can learn to manage their back pain on their own.  HOME CARE   Stay active. Start with short walks on flat ground if you can. Try to walk farther each day.  Do not sit, drive, or stand in one place for more than 30 minutes. Do not stay in bed.  Do not avoid exercise or work. Activity can help your back heal faster.  Be careful when you bend or lift an object. Bend at your knees, keep the object close to you, and do not twist.  Sleep on a firm mattress. Lie on your side, and bend your knees. If you lie on your back, put a pillow under  your knees.  Only take medicines as told by your doctor.  Put ice on the injured area.  Put ice in a plastic bag.  Place a towel between your skin and the bag.  Leave the ice on for 15-20 minutes, 03-04 times a day for the first 2 to 3 days. After that, you can switch between ice and heat packs.  Ask your doctor about back exercises or massage.  Avoid feeling anxious or stressed. Find good ways to deal with stress, such as exercise. GET HELP RIGHT AWAY IF:   Your pain does not go away with rest or medicine.  Your pain does not go away in 1 week.  You have new problems.  You do not  feel well.  The pain spreads into your legs.  You cannot control when you poop (bowel movement) or pee (urinate).  Your arms or legs feel weak or lose feeling (numbness).  You feel sick to your stomach (nauseous) or throw up (vomit).  You have belly (abdominal) pain.  You feel like you may pass out (faint). MAKE SURE YOU:   Understand these instructions.  Will watch your condition.  Will get help right away if you are not doing well or get worse. Document Released: 01/10/2008 Document Revised: 10/16/2011 Document Reviewed: 11/25/2013 [facility] Patient Information 2015 [place], [place]. This information is not intended to replace advice given to you by your health care provider. Make sure you discuss any questions you have with your health care provider.  Back Injury Prevention The following tips can help you to prevent a back injury. PHYSICAL FITNESS  Exercise often. Try to develop strong stomach (abdominal) muscles.  Do aerobic exercises often. This includes walking, jogging, biking, swimming.  Do exercises that help with balance and strength often. This includes tai chi and yoga.  Stretch before and after you exercise.  Keep a healthy weight. DIET   Ask your doctor how much calcium and vitamin D you need every day.  Include calcium in your diet. Foods high in calcium include dairy products; green, leafy vegetables; and products with calcium added (fortified).  Include vitamin D in your diet. Foods high in vitamin D include milk and products with vitamin D added.  Think about taking a multivitamin or other nutritional products called " supplements."  Stop smoking if you smoke. POSTURE   Sit and stand up straight. Avoid leaning forward or hunching over.  Choose chairs that support your lower back.  If you work at a desk:  Sit close to your work so you do not lean over.  Keep your chin tucked in.  Keep your neck drawn back.  Keep your elbows bent at a right  angle. Your arms should look like the letter "L."  Sit high and close to the steering wheel when you drive. Add low back support to your car seat if needed.  Avoid sitting or standing in one position for too long. Get up and move around every hour. Take breaks if you are driving for a long time.  Sleep on your side with your knees slightly bent. You can also sleep on your back with a pillow under your knees. Do not sleep on your stomach. LIFTING, TWISTING, AND REACHING  Avoid heavy lifting, especially lifting over and over again. If you must do heavy lifting:  Stretch before lifting.  Work slowly.  Rest between lifts.  Use carts and dollies to move objects when possible.  Make several small trips instead of carrying 1  heavy load.  Ask for help when you need it.  Ask for help when moving big, awkward objects.  Follow these steps when lifting:  Stand with your feet shoulder-width apart.  Get as close to the object as you can. Do not pick up heavy objects that are far from your body.  Use handles or lifting straps when possible.  Bend at your knees. Squat down, but keep your heels off the floor.  Keep your shoulders back, your chin tucked in, and your back straight.  Lift the object slowly. Tighten the muscles in your legs, stomach, and butt. Keep the object as close to the center of your body as possible.  Reverse these directions when you put a load down.  Do not:  Lift the object above your waist.  Twist at the waist while lifting or carrying a load. Move your feet if you need to turn, not your waist.  Bend over without bending at your knees.  Avoid reaching over your head, across a table, or for an object on a high surface. OTHER TIPS  Avoid wet floors and keep sidewalks clear of ice.  Do not sleep on a mattress that is too soft or too hard.  Keep items that you use often within easy reach.  Put heavier objects on shelves at waist level. Put lighter objects  on lower or higher shelves.  Find ways to lessen your stress. You can try exercise, massage, or relaxation.  Get help for depression or anxiety if needed. GET HELP IF:  You injure your back.  You have questions about diet, exercise, or other ways to prevent back injuries. MAKE SURE YOU:  Understand these instructions.  Will watch your condition.  Will get help right away if you are not doing well or get worse. Document Released: 01/10/2008 Document Revised: 10/16/2011 Document Reviewed: 09/04/2011 [facility] Patient Information 2015 [place], [place]. This information is not intended to replace advice given to you by your health care provider. Make sure you discuss any questions you have with your health care provider.  Contusion A contusion is a deep bruise. Contusions happen when an injury causes bleeding under the skin. Signs of bruising include pain, puffiness (swelling), and discolored skin. The contusion may turn blue, purple, or yellow. HOME CARE   Put ice on the injured area.  Put ice in a plastic bag.  Place a towel between your skin and the bag.  Leave the ice on for 15-20 minutes, 03-04 times a day.  Only take medicine as told by your doctor.  Rest the injured area.  If possible, raise (elevate) the injured area to lessen puffiness. GET HELP RIGHT AWAY IF:   You have more bruising or puffiness.  You have pain that is getting worse.  Your puffiness or pain is not helped by medicine. MAKE SURE YOU:   Understand these instructions.  Will watch your condition.  Will get help right away if you are not doing well or get worse. Document Released: 01/10/2008 Document Revised: 10/16/2011 Document Reviewed: 05/29/2011 [facility] Patient Information 2015 [place], [place]. This information is not intended to replace advice given to you by your health care provider. Make sure you discuss any questions you have with your health care provider.  Cryotherapy Cryotherapy  means treatment with cold. Ice or gel packs can be used to reduce both pain and swelling. Ice is the most helpful within the first 24 to 48 hours after an injury or flare-up from overusing a muscle or joint.  Sprains, strains, spasms, burning pain, shooting pain, and aches can all be eased with ice. Ice can also be used when recovering from surgery. Ice is effective, has very few side effects, and is safe for most people to use. PRECAUTIONS  Ice is not a safe treatment option for people with:  Raynaud phenomenon. This is a condition affecting small blood vessels in the extremities. Exposure to cold may cause your problems to return.  Cold hypersensitivity. There are many forms of cold hypersensitivity, including:  Cold urticaria. Red, itchy hives appear on the skin when the tissues begin to warm after being iced.  Cold erythema. This is a red, itchy rash caused by exposure to cold.  Cold hemoglobinuria. Red blood cells break down when the tissues begin to warm after being iced. The hemoglobin that carry oxygen are passed into the urine because they cannot combine with blood proteins fast enough.  Numbness or altered sensitivity in the area being iced. If you have any of the following conditions, do not use ice until you have discussed cryotherapy with your caregiver:  Heart conditions, such as arrhythmia, angina, or chronic heart disease.  High blood pressure.  Healing wounds or open skin in the area being iced.  Current infections.  Rheumatoid arthritis.  Poor circulation.  Diabetes. Ice slows the blood flow in the region it is applied. This is beneficial when trying to stop inflamed tissues from spreading irritating chemicals to surrounding tissues. However, if you expose your skin to cold temperatures for too long or without the proper protection, you can damage your skin or nerves. Watch for signs of skin damage due to cold. HOME CARE INSTRUCTIONS Follow these tips to use ice and  cold packs safely.  Place a dry or damp towel between the ice and skin. A damp towel will cool the skin more quickly, so you may need to shorten the time that the ice is used.  For a more rapid response, add gentle compression to the ice.  Ice for no more than 10 to 20 minutes at a time. The bonier the area you are icing, the less time it will take to get the benefits of ice.  Check your skin after 5 minutes to make sure there are no signs of a poor response to cold or skin damage.  Rest 20 minutes or more between uses.  Once your skin is numb, you can end your treatment. You can test numbness by very lightly touching your skin. The touch should be so light that you do not see the skin dimple from the pressure of your fingertip. When using ice, most people will feel these normal sensations in this order: cold, burning, aching, and numbness.  Do not use ice on someone who cannot communicate their responses to pain, such as small children or people with dementia. HOW TO MAKE AN ICE PACK Ice packs are the most common way to use ice therapy. Other methods include ice massage, ice baths, and cryosprays. Muscle creams that cause a cold, tingly feeling do not offer the same benefits that ice offers and should not be used as a substitute unless recommended by your caregiver. To make an ice pack, do one of the following:  Place crushed ice or a bag of frozen vegetables in a sealable plastic bag. Squeeze out the excess air. Place this bag inside another plastic bag. Slide the bag into a pillowcase or place a damp towel between your skin and the bag.  Mix 3 parts  water with 1 part rubbing alcohol. Freeze the mixture in a sealable plastic bag. When you remove the mixture from the freezer, it will be slushy. Squeeze out the excess air. Place this bag inside another plastic bag. Slide the bag into a pillowcase or place a damp towel between your skin and the bag. SEEK MEDICAL CARE IF:  You develop white  spots on your skin. This may give the skin a blotchy (mottled) appearance.  Your skin turns blue or pale.  Your skin becomes waxy or hard.  Your swelling gets worse. MAKE SURE YOU:   Understand these instructions.  Will watch your condition.  Will get help right away if you are not doing well or get worse. Document Released: 03/20/2011 Document Revised: 12/08/2013 Document Reviewed: 03/20/2011 [facility] Patient Information 2015 [place], [place]. This information is not intended to replace advice given to you by your health care provider. Make sure you discuss any questions you have with your health care provider.  Knee Sprain A knee sprain is a tear in one of the strong, fibrous tissues that connect the bones (ligaments) in your knee. The severity of the sprain depends on how much of the ligament is torn. The tear can be either partial or complete. CAUSES  Often, sprains are a result of a fall or injury. The force of the impact causes the fibers of your ligament to stretch too much. This excess tension causes the fibers of your ligament to tear. SIGNS AND SYMPTOMS  You may have some loss of motion in your knee. Other symptoms include:  Bruising.  Pain in the knee area.  Tenderness of the knee to the touch.  Swelling. DIAGNOSIS  To diagnose a knee sprain, your health care provider will physically examine your knee. Your health care provider may also suggest an X-ray exam of your knee to make sure no bones are broken. TREATMENT  If your ligament is only partially torn, treatment usually involves keeping the knee in a fixed position (immobilization) or bracing your knee for activities that require movement for several weeks. To do this, your health care provider will apply a bandage, cast, or splint to keep your knee from moving and to support your knee during movement until it heals. For a partially torn ligament, the healing process usually takes 4-6 weeks. If your ligament is  completely torn, depending on which ligament it is, you may need surgery to reconnect the ligament to the bone or reconstruct it. After surgery, a cast or splint may be applied and will need to stay on your knee for 4-6 weeks while your ligament heals. HOME CARE INSTRUCTIONS  Keep your injured knee elevated to decrease swelling.  To ease pain and swelling, apply ice to the injured area:  Put ice in a plastic bag.  Place a towel between your skin and the bag.  Leave the ice on for 20 minutes, 2-3 times a day.  Only take medicine for pain as directed by your health care provider.  Do not leave your knee unprotected until pain and stiffness go away (usually 4-6 weeks).  If you have a cast or splint, do not allow it to get wet. If you have been instructed not to remove it, cover it with a plastic bag when you shower or bathe. Do not swim.  Your health care provider may suggest exercises for you to do during your recovery to prevent or limit permanent weakness and stiffness. SEEK IMMEDIATE MEDICAL CARE IF:  Your cast or  splint becomes damaged.  Your pain becomes worse.  You have significant pain, swelling, or numbness below the cast or splint. MAKE SURE YOU:  Understand these instructions.  Will watch your condition.  Will get help right away if you are not doing well or get worse. Document Released: 07/24/2005 Document Revised: 05/14/2013 Document Reviewed: 03/05/2013 [facility] Patient Information 2015 [place], [place]. This information is not intended to replace advice given to you by your health care provider. Make sure you discuss any questions you have with your health care provider.

## 2015-03-20 NOTE — ED Provider Notes (Signed)
CSN: [id number]     Arrival date & time 03/20/15  1832 History  This chart was scribed for non-physician practitioner Zacarias Pontes, PA-C, working with Daleen Bo, MD, by Eustaquio Maize, ED Scribe. This patient was seen in room WTR7/WTR7 and the patient's care was started at 6:46 PM.  Chief Complaint  Patient presents with  . Knee Pain  . Back Pain   Patient is a 49 y.o. female presenting with knee pain and back pain. The history is provided by the patient. No language interpreter was used.  Knee Pain Location:  Knee Time since incident:  1 hour Injury: yes   Mechanism of injury: fall   Fall:    Fall occurred:  Standing   Impact surface:  Hard floor   Point of impact:  Knees   Entrapped after fall: no   Knee location:  L knee Pain details:    Quality:  Sharp   Radiates to:  L leg   Severity:  Severe   Onset quality:  Sudden   Duration:  1 hour   Timing:  Constant   Progression:  Unchanged Chronicity:  New Foreign body present:  No foreign bodies Relieved by:  None tried Exacerbated by: movement. Ineffective treatments:  None tried Associated symptoms: back pain and swelling   Associated symptoms: no decreased ROM, no muscle weakness, no neck pain, no numbness and no tingling   Back Pain Associated symptoms: no abdominal pain, no chest pain, no dysuria, no headaches, no numbness and no weakness      HPI Comments: [name] is a 49 y.o. female who presents to the Emergency Department complaining of sudden onset left knee pain and diffuse back pain s/p mechanical fall that occurred around 5:45 PM (1 hour ago) after slipping on a puddle of water at [employer]. Pt states that she landed on her knee on the hard surface. The knee pain is 8/10, constant, sharp in sensation, radiating up to left thigh, worsening with palpation and movement, with no treatments PTA. Associated symptoms include mild swelling to knee. No head injury or LOC. Denies numbness, tingling,  weakness, abdominal pain, nausea, vomiting, chest pain, shortness of breath, HA, visual disturbances, dysuria, hematuria, urinary or bowel incontinence, saddle anesthesia, abrasions, bruising, or any other associated symptoms.   Past Medical History  Diagnosis Date  . Hypertension   . Asthma     no inhaler  . PONV (postoperative nausea and vomiting) 2006   Past Surgical History  Procedure Laterality Date  . Cesarean section  1995  . Tubal ligation  2006  . Hysteroscopy N/A 12/13/2012    Procedure: HYSTEROSCOPY/IUD REMOVAL;  Surgeon: Marvene Staff, MD;  Location: [facility];  Service: Gynecology;  Laterality: N/A;   No family history on file. Social History  Substance Use Topics  . Smoking status: Former Smoker    Types: Cigarettes    Quit date: 05/04/2012  . Smokeless tobacco: None  . Alcohol Use: No   OB History    No data available     Review of Systems  HENT: Negative for facial swelling (no head inj).   Eyes: Negative for visual disturbance.  Respiratory: Negative for shortness of breath.   Cardiovascular: Negative for chest pain.  Gastrointestinal: Negative for nausea, vomiting and abdominal pain.  Genitourinary: Negative for dysuria and hematuria.       No urinary incontinence  Musculoskeletal: Positive for back pain and arthralgias (Left knee pain radiating up to right thigh). Negative for neck  pain.  Skin: Negative for color change and wound.  Allergic/Immunologic: Negative for immunocompromised state.  Neurological: Negative for syncope, weakness, numbness and headaches.   10 Systems reviewed and all are negative for acute change except as noted in the HPI.   Allergies  Shellfish allergy  Home Medications   Prior to Admission medications   Medication Sig Start Date End Date Taking? Authorizing Provider  albuterol (PROVENTIL HFA;VENTOLIN HFA) 108 (90 BASE) MCG/ACT inhaler Inhale 2 puffs into the lungs every 4 (four) hours as needed for wheezing or  shortness of breath. 12/01/13   Harden Mo, MD  amLODipine-olmesartan (AZOR) 5-20 MG per tablet Take 1 tablet by mouth daily.    Historical Provider, MD  beclomethasone (QVAR) 80 MCG/ACT inhaler Inhale 2 puffs into the lungs 2 (two) times daily. 12/01/13   Harden Mo, MD  Cholecalciferol ([employer] VIT D 5000 HIGH-POTENCY) 5000 UNITS capsule Take 5,000 Units by mouth daily.    Historical Provider, MD  hydroxypropyl methylcellulose (ISOPTO TEARS) 2.5 % ophthalmic solution Place 2 drops into both eyes as needed.    Historical Provider, MD  ibuprofen (ADVIL,MOTRIN) 200 MG tablet Take 200 mg by mouth every 6 (six) hours as needed. Pain    Historical Provider, MD  loratadine (CLARITIN) 10 MG tablet Take 10 mg by mouth daily.    Historical Provider, MD  montelukast (SINGULAIR) 10 MG tablet Take 10 mg by mouth at bedtime.    Historical Provider, MD  Multiple Vitamin (MULTIVITAMIN WITH MINERALS) TABS Take 1 tablet by mouth daily.    Historical Provider, MD  predniSONE (DELTASONE) 20 MG tablet Take 3 daily for 5 days, 2 daily for 5 days, 1 daily for 5 days. 12/01/13   Harden Mo, MD   Triage Vitals: BP 121/74 mmHg  Pulse 90  Temp(Src) 98.2 F (36.8 C)  Resp 18  SpO2 94%   Physical Exam  Constitutional: She is oriented to person, place, and time. Vital signs are normal. She appears well-developed and well-nourished.  Non-toxic appearance. No distress.  Afebrile, nontoxic, NAD  HENT:  Head: Normocephalic and atraumatic.  Mouth/Throat: Mucous membranes are normal.  Eyes: Conjunctivae and EOM are normal. Right eye exhibits no discharge. Left eye exhibits no discharge.  Neck: Normal range of motion. Neck supple. No spinous process tenderness and no muscular tenderness present. No rigidity. Normal range of motion present.  Cardiovascular: Normal rate and intact distal pulses.   Pulmonary/Chest: Effort normal. No respiratory distress.  Abdominal: Normal appearance. She exhibits no distension.   Musculoskeletal: Normal range of motion.       Left knee: She exhibits swelling. She exhibits normal range of motion, no deformity, no laceration, no erythema, normal alignment, no LCL laxity, normal patellar mobility and no MCL laxity. Tenderness found. Medial joint line and lateral joint line tenderness noted.       Lumbar back: She exhibits tenderness and spasm. She exhibits normal range of motion and no deformity.       Back:       Legs: Lumbar spine with FROM intact with mild, diffuse spinous process TTP, no bony stepoffs or deformities, with bilateral paraspinous muscle TTP and muscle spasms. Strength 5/5 in all extremities, sensation grossly intact in all extremities, negative SLR bilaterally. No overlying skin changes. Left knee with FROM intact, with mild medial and lateral joint line TTP, mild swelling, no deformity, no bruising, no abnormal alignment or patellar mobility, no varus/valgus laxity, neg anterior drawer test, no crepitus. Distal pulses intact.  Skin intact.  Neurological: She is alert and oriented to person, place, and time. She has normal strength. No sensory deficit.  Skin: Skin is warm, dry and intact. No rash noted.  Psychiatric: She has a normal mood and affect. Her behavior is normal.  Nursing note and vitals reviewed.   ED Course  Procedures (including critical care time)  DIAGNOSTIC STUDIES: Oxygen Saturation is 94% on RA, adequate by my interpretation.    COORDINATION OF CARE: 6:55 PM-Discussed treatment plan which includes DG L Knee, DG L Spine with pt at bedside and pt agreed to plan.   Labs Review Labs Reviewed - No data to display  Imaging Review Dg Lumbar Spine Complete  03/20/2015   CLINICAL DATA:  Fall in grocery store. Low back pain. Initial encounter.  EXAM: LUMBAR SPINE - COMPLETE 4+ VIEW  COMPARISON:  None.  FINDINGS: Five non rib-bearing lumbar type vertebral bodies are present. Vertebral body heights and alignment are maintained. Focal  degenerative disc changes are present at L5-S1. Disc spaces are otherwise within normal limits. Atherosclerotic calcifications are present in the aorta and branch vessels without evidence for aneurysm.  IMPRESSION: 1. No acute abnormality. 2. Focal degenerative disc disease at L5-S1. 3. Atherosclerosis.   Electronically Signed   By: San Morelle M.D.   On: 03/20/2015 19:22   Dg Knee Complete 4 Views Left  03/20/2015   CLINICAL DATA:  Fall it groceries store.  Left knee pain.  EXAM: LEFT KNEE - COMPLETE 4+ VIEW  COMPARISON:  None.  FINDINGS: There is no evidence of fracture, dislocation, or joint effusion. There is no evidence of arthropathy or other focal bone abnormality. Soft tissues are unremarkable.  IMPRESSION: Negative left knee radiographs.   Electronically Signed   By: San Morelle M.D.   On: 03/20/2015 19:24      EKG Interpretation None      MDM   Final diagnoses:  Knee contusion, left, initial encounter  Knee sprain and strain, left, initial encounter  Midline low back pain without sciatica  Fall, initial encounter  Lumbar spondylosis, unspecified spinal osteoarthritis   49 y.o. female here with mechanical fall at [employer], landing on her L knee. All extremities NVI with soft compartments. Mild diffuse bony and paraspinous tenderness to lumbar region, and swelling/tenderness to L knee. Will obtain xray imaging. Pt declines pain meds here. Will reassess shortly.   7:29 PM Xray of lumbar spine with mild degenerative changes at L5/S1 otherwise no acute findings. Knee xray neg. Will treat as knee sprain and place in sleeve with crutches. Discussed RICE therapy. Will give pain meds for home and have her f/up with ortho in 1-2wks. I explained the diagnosis and have given explicit precautions to return to the ER including for any other new or worsening symptoms. The patient understands and accepts the medical plan as it's been dictated and I have answered their questions.  Discharge instructions concerning home care and prescriptions have been given. The patient is STABLE and is discharged to home in good condition.   I personally performed the services described in this documentation, which was scribed in my presence. The recorded information has been reviewed and is accurate.  BP 121/74 mmHg  Pulse 90  Temp(Src) 98.2 F (36.8 C)  Resp 18  SpO2 94% Meds ordered this encounter  Medications  . HYDROcodone-acetaminophen (NORCO) 5-325 MG per tablet    Sig: Take 1 tablet by mouth every 6 (six) hours as needed for severe pain.  Dispense:  6 tablet    Refill:  0    Order Specific Question:  Supervising Provider    Answer:  MILLER, BRIAN [[id number]]  . naproxen (NAPROSYN) 500 MG tablet    Sig: Take 1 tablet (500 mg total) by mouth 2 (two) times daily as needed for mild pain, moderate pain or headache (TAKE WITH MEALS.).    Dispense:  20 tablet    Refill:  0    Order Specific Question:  Supervising Provider    Answer:  Noemi Chapel [[id number]]     Sakari Alkhatib Camprubi-Soms, PA-C 03/20/15 1930  Daleen Bo, MD 03/21/15 0003

## 2015-05-26 ENCOUNTER — Other Ambulatory Visit: Payer: Self-pay

## 2015-05-26 DIAGNOSIS — Z1231 Encounter for screening mammogram for malignant neoplasm of breast: Secondary | ICD-10-CM

## 2015-06-18 ENCOUNTER — Ambulatory Visit
Admission: RE | Admit: 2015-06-18 | Discharge: 2015-06-18 | Disposition: A | Discharge status: Home / Self Care | Payer: BLUE CROSS/BLUE SHIELD | Source: Ambulatory Visit

## 2015-06-18 DIAGNOSIS — Z1231 Encounter for screening mammogram for malignant neoplasm of breast: Secondary | ICD-10-CM

## 2015-06-22 ENCOUNTER — Other Ambulatory Visit: Payer: Self-pay | Admitting: Internal Medicine

## 2015-06-22 DIAGNOSIS — R928 Other abnormal and inconclusive findings on diagnostic imaging of breast: Secondary | ICD-10-CM

## 2015-06-24 ENCOUNTER — Other Ambulatory Visit: Payer: BLUE CROSS/BLUE SHIELD

## 2015-06-29 ENCOUNTER — Ambulatory Visit
Admission: RE | Admit: 2015-06-29 | Discharge: 2015-06-29 | Disposition: A | Discharge status: Home / Self Care | Payer: BLUE CROSS/BLUE SHIELD | Source: Ambulatory Visit | Attending: Internal Medicine | Admitting: Internal Medicine

## 2015-06-29 ENCOUNTER — Other Ambulatory Visit: Payer: BLUE CROSS/BLUE SHIELD

## 2015-06-29 DIAGNOSIS — R928 Other abnormal and inconclusive findings on diagnostic imaging of breast: Secondary | ICD-10-CM

## 2015-12-03 ENCOUNTER — Other Ambulatory Visit: Payer: Self-pay | Admitting: Physician Assistant

## 2015-12-03 ENCOUNTER — Other Ambulatory Visit: Payer: Self-pay | Admitting: Internal Medicine

## 2015-12-03 DIAGNOSIS — N6489 Other specified disorders of breast: Secondary | ICD-10-CM

## 2015-12-08 ENCOUNTER — Other Ambulatory Visit: Payer: Self-pay | Admitting: Physician Assistant

## 2015-12-08 DIAGNOSIS — N6489 Other specified disorders of breast: Secondary | ICD-10-CM

## 2016-01-24 ENCOUNTER — Ambulatory Visit
Admission: RE | Admit: 2016-01-24 | Discharge: 2016-01-24 | Disposition: A | Discharge status: Home / Self Care | Payer: Managed Care, Other (non HMO) | Source: Ambulatory Visit | Attending: Internal Medicine | Admitting: Internal Medicine

## 2016-01-24 DIAGNOSIS — N6489 Other specified disorders of breast: Secondary | ICD-10-CM

## 2016-03-17 LAB — HM COLONOSCOPY

## 2017-03-13 ENCOUNTER — Telehealth (INDEPENDENT_AMBULATORY_CARE_PROVIDER_SITE_OTHER): Payer: Self-pay | Admitting: Orthopaedic Surgery

## 2017-03-13 NOTE — Telephone Encounter (Signed)
I talked to patient in length. She was a previous patient of Dr. Nicolasa Ducking prior to [place] going on [facility]. She is searching for her records and unfortunately they are not in [facility]. I suggested calling [facility] and possibility they will be able to pull up records as we lost access to many files. She expressed understanding and I told her to call me if I could be of any more help

## 2018-03-26 MED FILL — OLMESARTAN MEDOXOMIL 20 MG: 20 | 90 days supply | Qty: 90 | Fill #0

## 2018-03-26 MED FILL — SPIRONOLACTONE 25 MG TABLET: 25 | 30 days supply | Qty: 30 | Fill #0

## 2018-03-29 ENCOUNTER — Ambulatory Visit: Payer: Managed Care, Other (non HMO) | Admitting: Family Medicine

## 2018-04-11 ENCOUNTER — Encounter: Payer: Self-pay | Admitting: Family Medicine

## 2018-04-11 ENCOUNTER — Ambulatory Visit (INDEPENDENT_AMBULATORY_CARE_PROVIDER_SITE_OTHER): Payer: No Typology Code available for payment source | Admitting: Family Medicine

## 2018-04-11 VITALS — BP 110/76 | HR 72 | Temp 98.8°F | Ht 63.0 in | Wt 202.0 lb

## 2018-04-11 DIAGNOSIS — I10 Essential (primary) hypertension: Secondary | ICD-10-CM

## 2018-04-11 DIAGNOSIS — E041 Nontoxic single thyroid nodule: Secondary | ICD-10-CM

## 2018-04-11 DIAGNOSIS — J4520 Mild intermittent asthma, uncomplicated: Secondary | ICD-10-CM | POA: Diagnosis not present

## 2018-04-11 DIAGNOSIS — Z87448 Personal history of other diseases of urinary system: Secondary | ICD-10-CM

## 2018-04-11 DIAGNOSIS — N281 Cyst of kidney, acquired: Secondary | ICD-10-CM | POA: Diagnosis not present

## 2018-04-11 DIAGNOSIS — Z7689 Persons encountering health services in other specified circumstances: Secondary | ICD-10-CM

## 2018-04-11 DIAGNOSIS — R0982 Postnasal drip: Secondary | ICD-10-CM

## 2018-04-11 DIAGNOSIS — R059 Cough, unspecified: Secondary | ICD-10-CM

## 2018-04-11 DIAGNOSIS — R05 Cough: Secondary | ICD-10-CM

## 2018-04-11 MED ORDER — ALBUTEROL SULFATE 108 (90 BASE) MCG/ACT IN AEPB: 2.0000 | INHALATION_SPRAY | RESPIRATORY_TRACT | 5 refills | Status: DC | PRN

## 2018-04-11 MED ORDER — FLUTICASONE PROPIONATE 50 MCG/ACT NA SUSP: 1.0000 | Freq: Every day | NASAL | 0 refills | Status: DC

## 2018-04-11 MED FILL — FLUTICASONE PROP 50 MCG SPR: 50 | 60 days supply | Qty: 16 | Fill #0

## 2018-04-11 MED FILL — PROAIR RESPICLICK INHAL PWD: 108 (90 BAS | 17 days supply | Qty: 1 | Fill #0

## 2018-04-11 NOTE — Patient Instructions (Signed)
Asthma, Adult Asthma is a recurring condition in which the airways tighten and narrow. Asthma can make it difficult to breathe. It can cause coughing, wheezing, and shortness of breath. Asthma episodes, also called asthma attacks, range from minor to life-threatening. Asthma cannot be cured, but medicines and lifestyle changes can help control it. What are the causes? Asthma is believed to be caused by inherited (genetic) and environmental factors, but its exact cause is unknown. Asthma may be triggered by allergens, lung infections, or irritants in the air. Asthma triggers are different for each person. Common triggers include:  Animal dander.  Dust mites.  Cockroaches.  Pollen from trees or grass.  Mold.  Smoke.  Air pollutants such as dust, household cleaners, hair sprays, aerosol sprays, paint fumes, strong chemicals, or strong odors.  Cold air, weather changes, and winds (which increase molds and pollens in the air).  Strong emotional expressions such as crying or laughing hard.  Stress.  Certain medicines (such as aspirin) or types of drugs (such as beta-blockers).  Sulfites in foods and drinks. Foods and drinks that may contain sulfites include dried fruit, potato chips, and sparkling grape juice.  Infections or inflammatory conditions such as the flu, a cold, or an inflammation of the nasal membranes (rhinitis).  Gastroesophageal reflux disease (GERD).  Exercise or strenuous activity.  What are the signs or symptoms? Symptoms may occur immediately after asthma is triggered or many hours later. Symptoms include:  Wheezing.  Excessive nighttime or early morning coughing.  Frequent or severe coughing with a common cold.  Chest tightness.  Shortness of breath.  How is this diagnosed? The diagnosis of asthma is made by a review of your medical history and a physical exam. Tests may also be performed. These may include:  Lung function studies. These tests show how  much air you breathe in and out.  Allergy tests.  Imaging tests such as X-rays.  How is this treated? Asthma cannot be cured, but it can usually be controlled. Treatment involves identifying and avoiding your asthma triggers. It also involves medicines. There are 2 classes of medicine used for asthma treatment:  Controller medicines. These prevent asthma symptoms from occurring. They are usually taken every day.  Reliever or rescue medicines. These quickly relieve asthma symptoms. They are used as needed and provide short-term relief.  Your health care provider will help you create an asthma action plan. An asthma action plan is a written plan for managing and treating your asthma attacks. It includes a list of your asthma triggers and how they may be avoided. It also includes information on when medicines should be taken and when their dosage should be changed. An action plan may also involve the use of a device called a peak flow meter. A peak flow meter measures how well the lungs are working. It helps you monitor your condition. Follow these instructions at home:  Take medicines only as directed by your health care provider. Speak with your health care provider if you have questions about how or when to take the medicines.  Use a peak flow meter as directed by your health care provider. Record and keep track of readings.  Understand and use the action plan to help minimize or stop an asthma attack without needing to seek medical care.  Control your home environment in the following ways to help prevent asthma attacks: ? Do not smoke. Avoid being exposed to secondhand smoke. ? Change your heating and air conditioning filter regularly. ? Limit   your use of fireplaces and wood stoves. ? Get rid of pests (such as roaches and mice) and their droppings. ? Throw away plants if you see mold on them. ? Clean your floors and dust regularly. Use unscented cleaning products. ? Try to have someone  else vacuum for you regularly. Stay out of rooms while they are being vacuumed and for a short while afterward. If you vacuum, use a dust mask from a hardware store, a double-layered or microfilter vacuum cleaner bag, or a vacuum cleaner with a HEPA filter. ? Replace carpet with wood, tile, or vinyl flooring. Carpet can trap dander and dust. ? Use allergy-proof pillows, mattress covers, and box spring covers. ? Wash bed sheets and blankets every week in hot water and dry them in a dryer. ? Use blankets that are made of polyester or cotton. ? Clean bathrooms and kitchens with bleach. If possible, have someone repaint the walls in these rooms with mold-resistant paint. Keep out of the rooms that are being cleaned and painted. ? Wash hands frequently. Contact a health care provider if:  You have wheezing, shortness of breath, or a cough even if taking medicine to prevent attacks.  The colored mucus you cough up (sputum) is thicker than usual.  Your sputum changes from clear or white to yellow, green, gray, or bloody.  You have any problems that may be related to the medicines you are taking (such as a rash, itching, swelling, or trouble breathing).  You are using a reliever medicine more than 2-3 times per week.  Your peak flow is still at 50-79% of your personal best after following your action plan for 1 hour.  You have a fever. Get help right away if:  You seem to be getting worse and are unresponsive to treatment during an asthma attack.  You are short of breath even at rest.  You get short of breath when doing very little physical activity.  You have difficulty eating, drinking, or talking due to asthma symptoms.  You develop chest pain.  You develop a fast heartbeat.  You have a bluish color to your lips or fingernails.  You are light-headed, dizzy, or faint.  Your peak flow is less than 50% of your personal best. This information is not intended to replace advice given to  you by your health care provider. Make sure you discuss any questions you have with your health care provider. Document Released: 07/24/2005 Document Revised: 01/05/2016 Document Reviewed: 02/20/2013 Elsevier Interactive Patient Education  2017 [place] Drip Postnasal drip is the feeling of mucus going down the back of your throat. Mucus is a slimy substance that moistens and cleans your nose and throat, as well as the air pockets in face bones near your forehead and cheeks (sinuses). Small amounts of mucus pass from your nose and sinuses down the back of your throat all the time. This is normal. When you produce too much mucus or the mucus gets too thick, you can feel it. Some common causes of postnasal drip include:  Having more mucus because of: ? A cold or the flu. ? Allergies. ? Cold air. ? Certain medicines.  Having more mucus that is thicker because of: ? A sinus or nasal infection. ? Dry air. ? A food allergy.  Follow these instructions at home: Relieving discomfort  Gargle with a salt-water mixture 3-4 times a day or as needed. To make a salt-water mixture, completely dissolve -1 tsp of salt in 1 cup  of warm water.  If the air in your home is dry, use a humidifier to add moisture to the air.  Use a saline spray or container (neti pot) to flush out the nose (nasal irrigation). These methods can help clear away mucus and keep the nasal passages moist. General instructions  Take over-the-counter and prescription medicines only as told by your health care provider.  Follow instructions from your health care provider about eating or drinking restrictions. You may need to avoid caffeine.  Avoid things that you know you are allergic to (allergens), like dust, mold, pollen, pets, or certain foods.  Drink enough fluid to keep your urine pale yellow.  Keep all follow-up visits as told by your health care provider. This is important. Contact a health care  provider if:  You have a fever.  You have a sore throat.  You have difficulty swallowing.  You have headache.  You have sinus pain.  You have a cough that does not go away.  The mucus from your nose becomes thick and is green or yellow in color.  You have cold or flu symptoms that last more than 10 days. Summary  Postnasal drip is the feeling of mucus going down the back of your throat.  If your health care provider approves, use nasal irrigation or a nasal spray 2?4 times a day.  Avoid things that you know you are allergic to (allergens), like dust, mold, pollen, pets, or certain foods. This information is not intended to replace advice given to you by your health care provider. Make sure you discuss any questions you have with your health care provider. Document Released: 11/06/2016 Document Revised: 11/06/2016 Document Reviewed: 11/06/2016 Elsevier Interactive Patient Education  [employer].

## 2018-04-11 NOTE — Progress Notes (Signed)
Patient presents to clinic today to establish care.  SUBJECTIVE: PMH: pt is a 52 yo female with pmh sig for asthma, HTN, h/o thyroid nodules, chronic hematuria, benign renal cyst.  Patient was previously seen at [facility] by Lottie Dawson.  Patient was also followed by numerous specialist.  Asthma: -Endorses symptoms with changes in the season. -Patient requesting refill on albuterol inhaler. -In the past was also using Qvar.  Cough, acute: -Patient endorses ongoing cough x2 weeks, fatigue, and some nasal drainage. -She denies fever, chills, sore throat, facial pain, ear pain or pressure. -Patient has tried OTC cold and flu and allergy medications. -Patient denies history of allergies.  HTN: -Taking Spironolactone 25 mg daily, amlodipine-olmesartan 5-20 mg daily -cooking more at home.  Eating more fish and vegetables. -using Ms. Dash instead of salt.  Drinking only water. -exercises by doing [employer] 2x/wk.  [employer] instructor does pt's meal plans  H/o thyroid nodule(s): -stable per u/s -yearly f/u  H/o renal cysts: -told benign -unsure of last imaing  Hematuria: -chronic issue -was followed by Urology -moved prior to having cystoscopy done -requesting referral  Allergies: Shellfish, walnuts, pecans, macadamia nuts- itching/swelling NKDA  Past surgical history: -C-section 1995  Social history: Patient is married.  She has 1 son.  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  Pt denies alcohol, drug, and current tobacco use.  Patient is a former smoker.   Health Maintenance: Dental --Dr. Gwenyth Ober Vision --[facility] Endocrinologist--Matthew Hartford Poli, MD Abbe Amsterdam, MD Orthopedist--Snow B. Daws, MD Immunizations --tetanus vaccine 12/2014, influenza vaccine 2018 Colonoscopy --03/2016 Mammogram --2018.  ptstates she is due for this.   PAP --03/13/2017   Family medical history: Mom-arthritis,  HTN Father-deceased Sister-Loretta, lupus, kidney disease Brother-Winston, HTN Brother-Wayne, HTN  Past Medical History:  Diagnosis Date  . Asthma    no inhaler  . Hypertension   . PONV (postoperative nausea and vomiting) 2006    Past Surgical History:  Procedure Laterality Date  . CESAREAN SECTION  1995  . HYSTEROSCOPY N/A 12/13/2012   Procedure: HYSTEROSCOPY/IUD REMOVAL;  Surgeon: Marvene Staff, MD;  Location: [facility];  Service: Gynecology;  Laterality: N/A;  . TUBAL LIGATION  2006    Current Outpatient Medications on File Prior to Visit  Medication Sig Dispense Refill  . albuterol (PROVENTIL HFA;VENTOLIN HFA) 108 (90 BASE) MCG/ACT inhaler Inhale 2 puffs into the lungs every 4 (four) hours as needed for wheezing or shortness of breath. 1 Inhaler 12  . amLODipine-olmesartan (AZOR) 5-20 MG per tablet Take 1 tablet by mouth daily.    Marland Kitchen olmesartan (BENICAR) 20 MG tablet Take 20 mg by mouth daily.    Marland Kitchen spironolactone (ALDACTONE) 25 MG tablet Take 25 mg by mouth daily.    . Vitamin D, Cholecalciferol, 1000 units CAPS Take 1,000 mg by mouth daily.     No current facility-administered medications on file prior to visit.     Allergies  Allergen Reactions  . Shellfish Allergy Hives and Itching    History reviewed. No pertinent family history.  Social History   Socioeconomic History  . Marital status: Married    Spouse name: Not on file  . Number of children: Not on file  . Years of education: Not on file  . Highest education level: Not on file  Occupational History  . Not on file  Social Needs  . Financial resource strain: Not on file  . Food insecurity:    Worry:  Not on file    Inability: Not on file  . Transportation needs:    Medical: Not on file    Non-medical: Not on file  Tobacco Use  . Smoking status: Former Smoker    Types: Cigarettes    Last attempt to quit: 05/04/2012    Years since quitting: 5.9  . Smokeless tobacco: Never Used  Substance and  Sexual Activity  . Alcohol use: No  . Drug use: No  . Sexual activity: Never  Lifestyle  . Physical activity:    Days per week: Not on file    Minutes per session: Not on file  . Stress: Not on file  Relationships  . Social connections:    Talks on phone: Not on file    Gets together: Not on file    Attends religious service: Not on file    Active member of club or organization: Not on file    Attends meetings of clubs or organizations: Not on file    Relationship status: Not on file  . Intimate partner violence:    Fear of current or ex partner: Not on file    Emotionally abused: Not on file    Physically abused: Not on file    Forced sexual activity: Not on file  Other Topics Concern  . Not on file  Social History Narrative  . Not on file    ROS General: Denies fever, chills, night sweats, changes in weight, changes in appetite HEENT: Denies headaches, ear pain, changes in vision, rhinorrhea, sore throat CV: Denies CP, palpitations, SOB, orthopnea Pulm: Denies SOB  + cough, wheezing GI: Denies abdominal pain, nausea, vomiting, diarrhea, constipation GU: Denies dysuria, frequency, vaginal discharge  + hematuria Msk: Denies muscle cramps, joint pains Neuro: Denies weakness, numbness, tingling Skin: Denies rashes, bruising Psych: Denies depression, anxiety, hallucinations  BP 110/76 (BP Location: Left Arm, Patient Position: Sitting, Cuff Size: Normal)   Pulse 72   Temp 98.8 F (37.1 C) (Oral)   Ht 5\' 3"  (1.6 m)   Wt 202 lb (91.6 kg)   SpO2 97%   BMI 35.78 kg/m   Physical Exam Gen. Pleasant, well developed, well-nourished, in NAD HEENT - [place]/AT, PERRL, conjunctive clear, no scleral icterus, no nasal drainage, pharynx with postnasal drainage, no erythema or exudate.  TMs normal bilaterally.  No cervical lymphadenopathy. Lungs: no use of accessory muscles, scattered faint wheezes, no rales or rhonchi.  CTAB after neb. Cardiovascular: RRR, No r/g/m, no peripheral  edema Abdomen: BS present, soft, nontender, nondistended Musculoskeletal: No deformities, moves all four extremities, no cyanosis or clubbing, normal tone Neuro:  A&Ox3, CN II-XII intact, normal gait Skin:  Warm, dry, intact, no lesions  No results found for this or any previous visit (from the past 2160 hour(s)).  Assessment/Plan: Mild intermittent asthma without complication -Avoid allergens -Given faint wheezing, DuoNeb given in clinic.  Lungs CTAB s/p neb treatment. - Plan: Albuterol Sulfate (PROAIR RESPICLICK) 269 (90 Base) MCG/ACT AEPB  Essential hypertension -Controlled -Continue current medication Norvasc-olmesartan 5-20 mg daily -Continue lifestyle modifications  Thyroid nodule -will need yearly follow-up with ultrasound  Renal cyst  Encounter to establish care -We reviewed the PMH, PSH, FH, SH, Meds and Allergies. -We provided refills for any medications we will prescribe as needed. -We addressed current concerns per orders and patient instructions. -We have asked for records for pertinent exams, studies, vaccines and notes from previous providers. -We have advised patient to follow up per instructions below.  Cough  - Plan:  fluticasone (FLONASE) 50 MCG/ACT nasal spray  Post-nasal drainage  - Plan: fluticasone (FLONASE) 50 MCG/ACT nasal spray  History of hematuria - Plan: Ambulatory referral to Urology  Follow-up PRN.  Schedule CPE in the next few months  Grier Mitts, MD

## 2018-04-21 ENCOUNTER — Encounter: Payer: Self-pay | Admitting: Family Medicine

## 2018-04-22 ENCOUNTER — Encounter: Payer: Self-pay | Admitting: Family Medicine

## 2018-04-22 ENCOUNTER — Telehealth: Payer: Self-pay

## 2018-04-22 ENCOUNTER — Telehealth: Payer: Self-pay | Admitting: Family Medicine

## 2018-04-22 NOTE — Telephone Encounter (Signed)
Pt requested if Dr Volanda Napoleon can sent any medication since she was not feeling better from previous visit with dr Volanda Napoleon, Spoke with pt offered to schedule an appointment to see another provider in the office for this problem, pt stated that she is not able to leave or get time off to come to the clinic but will try use her Breathing treatments to see if she feels better, pt requested to have Dr Volanda Napoleon sent something stronger to her pharmacy when she returns to the office. Pt voiced understanding

## 2018-04-22 NOTE — Telephone Encounter (Signed)
Copied from [place] [id number]. Topic: General - Other >> Apr 22, 2018  9:19 AM Lennox Solders wrote: Reason for CRM: pt is calling she saw dr banks on 04-11-18 . Pt was dx with asthma. The pt is now coughing up yellow phlegm. Pt has sent dr [employer]. [facility] out pt pharm

## 2018-04-23 ENCOUNTER — Other Ambulatory Visit: Payer: Self-pay

## 2018-04-23 ENCOUNTER — Ambulatory Visit (INDEPENDENT_AMBULATORY_CARE_PROVIDER_SITE_OTHER): Payer: No Typology Code available for payment source | Admitting: Family Medicine

## 2018-04-23 ENCOUNTER — Encounter: Payer: Self-pay | Admitting: Family Medicine

## 2018-04-23 DIAGNOSIS — J4521 Mild intermittent asthma with (acute) exacerbation: Secondary | ICD-10-CM

## 2018-04-23 MED ORDER — PREDNISONE 20 MG PO TABS: ORAL_TABLET | ORAL | 0 refills | Status: DC

## 2018-04-23 MED ORDER — METHYLPREDNISOLONE ACETATE 80 MG/ML IJ SUSP
80.0000 mg | Freq: Once | INTRAMUSCULAR | Status: AC
  Administered 2018-04-23: 80 mg via INTRAMUSCULAR

## 2018-04-23 MED FILL — predniSONE 20 MG TABS: 20 | 5 days supply | Qty: 10 | Fill #0

## 2018-04-23 NOTE — Addendum Note (Signed)
Addended by: Anibal Henderson on: 04/23/2018 09:49 AM   Modules accepted: Orders

## 2018-04-23 NOTE — Patient Instructions (Signed)
Follow up for any fever or increased shortness of breath. 

## 2018-04-23 NOTE — Telephone Encounter (Signed)
Pt requested if Dr Volanda Napoleon can sent any medication since she was not feeling better from previous visit with dr Volanda Napoleon, Spoke with pt offered to schedule an appointment to see another provider in the office for this problem, pt stated that she is not able to leave or get time off to come to the clinic but will try use her Breathing treatments to see if she feels better, pt requested to have Dr Volanda Napoleon sent something stronger to her pharmacy when she returns to the office. Pt voiced understanding

## 2018-04-23 NOTE — Progress Notes (Signed)
  Subjective:     Patient ID: [name], female   DOB: 1966/04/12, 52 y.o.   MRN: [id number]  HPI Patient is here with cough and some mild wheezing. She has history of mild intermittent asthma. She's been using pro-air inhaler as needed. She was seen here on 9/5 with presumed viral illness. Had no fever. Cough is mostly dry. Other respiratory symptoms have basically resolved. She does have some intermittent wheezing and cough which is worse at night. No fever. No hemoptysis. Quit smoking about 5 years ago.  Past Medical History:  Diagnosis Date  . Asthma    no inhaler  . Hypertension   . PONV (postoperative nausea and vomiting) 2006   Past Surgical History:  Procedure Laterality Date  . CESAREAN SECTION  1995  . HYSTEROSCOPY N/A 12/13/2012   Procedure: HYSTEROSCOPY/IUD REMOVAL;  Surgeon: Marvene Staff, MD;  Location: [facility];  Service: Gynecology;  Laterality: N/A;  . TUBAL LIGATION  2006    reports that she quit smoking about 5 years ago. Her smoking use included cigarettes. She has never used smokeless tobacco. She reports that she does not drink alcohol or use drugs. family history is not on file. Allergies  Allergen Reactions  . Peanut-Containing Drug Products Itching  . Shellfish Allergy Hives and Itching     Review of Systems  Constitutional: Negative for appetite change, chills, fever and unexpected weight change.  HENT: Negative for congestion and postnasal drip.   Respiratory: Positive for cough and wheezing. Negative for shortness of breath.   Cardiovascular: Negative for chest pain.       Objective:   Physical Exam  Constitutional: She appears well-developed and well-nourished.  Cardiovascular: Normal rate and regular rhythm.  Pulmonary/Chest: Effort normal. She has wheezes. She has no rales.  She has some mild expiratory wheezes. No retractions. No rales. Pulse oximetry 96%       Assessment:     Cough with mild reactive airway component. No  respiratory distress. She has mild intermittent asthma with acute exacerbation probably exacerbated by recent viral illness    Plan:     -Depo-Medrol 80 mg IM given -Continue albuterol as needed -Printed prescription for oral prednisone to use only if she is not getting adequate relief with the Depo-Medrol. Follow-up promptly for any fever or increased shortness of breath or if cough not resolving over the next few days  Eulas Post MD [facility]

## 2018-04-24 NOTE — Telephone Encounter (Signed)
Pt was seen by Dr Elease Hashimoto on 04/24/2018 for this issue. Nothing further needed

## 2018-04-25 ENCOUNTER — Encounter: Payer: Self-pay | Admitting: Family Medicine

## 2018-04-25 ENCOUNTER — Other Ambulatory Visit: Payer: Self-pay

## 2018-04-25 MED ORDER — SPIRONOLACTONE 25 MG PO TABS: 25.0000 mg | ORAL_TABLET | Freq: Every day | ORAL | 0 refills | Status: DC

## 2018-04-26 MED FILL — SPIRONOLACTONE 25 MG TABLET: 25 | 90 days supply | Qty: 90 | Fill #0

## 2018-06-03 ENCOUNTER — Ambulatory Visit (INDEPENDENT_AMBULATORY_CARE_PROVIDER_SITE_OTHER): Payer: No Typology Code available for payment source | Admitting: Internal Medicine

## 2018-06-03 ENCOUNTER — Encounter: Payer: Self-pay | Admitting: Internal Medicine

## 2018-06-03 VITALS — BP 118/78 | HR 68 | Temp 98.1°F | Ht 63.0 in | Wt 203.8 lb

## 2018-06-03 DIAGNOSIS — Z6836 Body mass index (BMI) 36.0-36.9, adult: Secondary | ICD-10-CM

## 2018-06-03 DIAGNOSIS — J4520 Mild intermittent asthma, uncomplicated: Secondary | ICD-10-CM | POA: Diagnosis not present

## 2018-06-03 DIAGNOSIS — R7309 Other abnormal glucose: Secondary | ICD-10-CM | POA: Diagnosis not present

## 2018-06-03 DIAGNOSIS — I10 Essential (primary) hypertension: Secondary | ICD-10-CM | POA: Insufficient documentation

## 2018-06-03 DIAGNOSIS — R42 Dizziness and giddiness: Secondary | ICD-10-CM

## 2018-06-03 DIAGNOSIS — J301 Allergic rhinitis due to pollen: Secondary | ICD-10-CM | POA: Diagnosis not present

## 2018-06-03 DIAGNOSIS — E661 Drug-induced obesity: Secondary | ICD-10-CM

## 2018-06-03 NOTE — Progress Notes (Signed)
Subjective:     Patient ID: [name] , female    DOB: 1966/07/23 , 52 y.o.   MRN: [id number]   Chief Complaint  Patient presents with  . Hypertension    HPI  SHE IS HERE TODAY TO RE-ESTABLISH PRIMARY CARE. SHE HAD TRANSFERRED TO [place] DUE TO INSURANCE REASONS. SHE WISHES TO RETURN HERE SINCE SHE IS NOW A [employer] EMPLOYEE. SHE  HAS H/O HTN.   Hypertension  This is a chronic problem. The current episode started more than 1 year ago. The problem is controlled. Risk factors for coronary artery disease include obesity. The current treatment provides moderate improvement.     Past Medical History:  Diagnosis Date  . Asthma    no inhaler  . Hypertension   . PONV (postoperative nausea and vomiting) 2006      Current Outpatient Medications:  .  albuterol (PROVENTIL HFA;VENTOLIN HFA) 108 (90 BASE) MCG/ACT inhaler, Inhale 2 puffs into the lungs every 4 (four) hours as needed for wheezing or shortness of breath., Disp: 1 Inhaler, Rfl: 12 .  Albuterol Sulfate (PROAIR RESPICLICK) 379 (90 Base) MCG/ACT AEPB, Inhale 2 puffs into the lungs every 4 (four) hours as needed., Disp: 1 each, Rfl: 5 .  DENTA 5000 PLUS 1.1 % CREA dental cream, , Disp: , Rfl:  .  fluticasone (FLONASE) 50 MCG/ACT nasal spray, Place 1 spray into both nostrils daily., Disp: 16 g, Rfl: 0 .  olmesartan (BENICAR) 20 MG tablet, Take 20 mg by mouth daily., Disp: , Rfl:  .  spironolactone (ALDACTONE) 25 MG tablet, Take 1 tablet (25 mg total) by mouth daily., Disp: 30 tablet, Rfl: 0 .  Vitamin D, Cholecalciferol, 1000 units CAPS, Take 1,000 mg by mouth daily., Disp: , Rfl:    Allergies  Allergen Reactions  . Peanut-Containing Drug Products Itching  . Shellfish Allergy Hives and Itching     Review of Systems  Constitutional: Negative.   HENT: Negative.   Eyes: Negative.   Respiratory: Negative.   Cardiovascular: Negative.   Gastrointestinal: Negative.   Neurological: Positive for dizziness (SHE REPORTS HAVING  DIZZINESS ON OCCASION. SOMETIMES OCCURS WHEN CHANGING POSITIONS.).  Psychiatric/Behavioral: Negative.      Today's Vitals   06/03/18 1435  BP: 118/78  Pulse: 68  Temp: 98.1 F (36.7 C)  TempSrc: Oral  SpO2: 97%  Weight: 203 lb 12.8 oz (92.4 kg)  Height: 5' 3"  (1.6 m)   Body mass index is 36.1 kg/m.   Objective:  Physical Exam  Constitutional: She is oriented to person, place, and time. She appears well-developed and well-nourished.  HENT:  Head: Normocephalic and atraumatic.  Eyes: EOM are normal.  Cardiovascular: Normal rate, regular rhythm and normal heart sounds.  Pulmonary/Chest: Effort normal and breath sounds normal.  Neurological: She is alert and oriented to person, place, and time.  Psychiatric: She has a normal mood and affect.  Nursing note and vitals reviewed.       Assessment And Plan:     1. Essential hypertension, benign  WELL CONTROLLED. SHE WILL CONTINUE WITH CURRENT MEDS. SHE IS ENCOURAGED TO AVOID ADDING SALT TO HER FOODS.   - CMP14+EGFR - TSH  2. Mild intermittent asthma without complication  CHRONIC, YET STABLE. SHE WILL CONTINUE WITH ALBUTEROL PRN.   3. Other abnormal glucose  HER A1C HAS BEEN ELEVATED IN THE PAST. I WILL CHECK AN A1C, BMET TODAY. SHE WAS ENCOURAGED TO AVOID SUGARY BEVERAGES AND PROCESSED FOODS INCLUDNG BREADS, RICE AND PASTA.  - Hemoglobin  A1c  4. Seasonal allergic rhinitis due to pollen  SHE WILL CONTINUE WITH FLONASE ONE SPRAY EACH NOSTRIL ONCE DAILY.    5. Class 2 drug-induced obesity with serious comorbidity and body mass index (BMI) of 36.0 to 36.9 in adult  SHE HAS LOST OVER 30 POUNDS SINCE HER LAST VISIT HERE ABOUT 2 YEARS AGO. SHE IS ENCOURAGED TO CONTINUE WITH HER CURRENT EFFORTS. SHE IS ENCOURAGED TO STRIVE FOR BMI LESS THAN 30 TO DECREASE CARDIAC RISK.   6. Dizziness  ORTHOSTATICS PERFORMED. THIS WAS NEGATIVE. SHE IS ENCOURAGED TO STAY WELL HYDRATED.    Maximino Greenland, MD

## 2018-06-03 NOTE — Patient Instructions (Signed)

## 2018-06-04 LAB — CMP14+EGFR
ALT: 14 IU/L (ref 0–32)
AST: 16 IU/L (ref 0–40)
Albumin/Globulin Ratio: 1.6 (ref 1.2–2.2)
Albumin: 4 g/dL (ref 3.5–5.5)
Alkaline Phosphatase: 50 IU/L (ref 39–117)
BUN/Creatinine Ratio: 17 (ref 9–23)
BUN: 13 mg/dL (ref 6–24)
Bilirubin Total: <0.2 mg/dL (ref 0.0–1.2)
CALCIUM: 9.7 mg/dL (ref 8.7–10.2)
CO2: 24 mmol/L (ref 20–29)
Chloride: 102 mmol/L (ref 96–106)
Creatinine, Ser: 0.76 mg/dL (ref 0.57–1.00)
GFR, EST AFRICAN AMERICAN: 104 mL/min/{1.73_m2} (ref >59)
GFR, EST NON AFRICAN AMERICAN: 90 mL/min/{1.73_m2} (ref >59)
GLUCOSE: 88 mg/dL (ref 65–99)
Globulin, Total: 2.5 g/dL (ref 1.5–4.5)
Potassium: 4.1 mmol/L (ref 3.5–5.2)
Sodium: 141 mmol/L (ref 134–144)
TOTAL PROTEIN: 6.5 g/dL (ref 6.0–8.5)

## 2018-06-04 LAB — TSH: TSH: 0.41 u[IU]/mL — AB (ref 0.450–4.500)

## 2018-06-04 LAB — HEMOGLOBIN A1C
Est. average glucose Bld gHb Est-mCnc: 120 mg/dL
Hgb A1c MFr Bld: 5.8 % — ABNORMAL HIGH (ref 4.8–5.6)

## 2018-06-04 NOTE — Progress Notes (Signed)
Here are your lab test results:  Your liver and kidney function are normal. Your hba1c is 5.8, this is in prediabetes range. Normal is 5.6 or less. You are almost there! Congratulations!  Your thyroid function is slightly low. I will add on additional tests. I will update you once these are available.   Keep up the great work! Take care.   Sincerely,    Keylie Beavers N. [facility], MD

## 2018-06-10 LAB — T3: T3 TOTAL: 109 ng/dL (ref 71–180)

## 2018-06-10 LAB — SPECIMEN STATUS REPORT

## 2018-06-10 LAB — T4: T4, Total: 8.3 ug/dL (ref 4.5–12.0)

## 2018-06-12 ENCOUNTER — Telehealth: Payer: Self-pay | Admitting: Family Medicine

## 2018-06-12 NOTE — Telephone Encounter (Signed)
Transfer from [facility] [place] to [facility] was Approved. I LMVM for the patient to call back and schedule a TOC appointment with [facility].

## 2018-06-12 NOTE — Telephone Encounter (Signed)
° ° ° °  Pt said she is ok no need for the transfer from [facility] to [facility]

## 2018-06-13 ENCOUNTER — Encounter: Payer: Self-pay | Admitting: Internal Medicine

## 2018-06-26 ENCOUNTER — Encounter: Payer: Self-pay | Admitting: Internal Medicine

## 2018-06-26 ENCOUNTER — Other Ambulatory Visit: Payer: Self-pay | Admitting: Internal Medicine

## 2018-06-26 MED ORDER — OLMESARTAN MEDOXOMIL 20 MG PO TABS: 20.0000 mg | ORAL_TABLET | Freq: Every day | ORAL | 1 refills | Status: DC

## 2018-06-26 MED FILL — OLMESARTAN MEDOXOMIL 20 MG: 20 | 90 days supply | Qty: 90 | Fill #0

## 2018-07-30 ENCOUNTER — Encounter: Payer: Self-pay | Admitting: Internal Medicine

## 2018-08-01 ENCOUNTER — Other Ambulatory Visit: Payer: Self-pay | Admitting: Family Medicine

## 2018-08-01 ENCOUNTER — Encounter: Payer: Self-pay | Admitting: Internal Medicine

## 2018-08-01 NOTE — Telephone Encounter (Signed)
Will Dr.Banks be ok with refilling this medication?

## 2018-08-01 NOTE — Telephone Encounter (Signed)
Can you refill this?

## 2018-08-03 ENCOUNTER — Encounter: Payer: Self-pay | Admitting: Internal Medicine

## 2018-08-05 MED FILL — SPIRONOLACTONE 25 MG TABLET: 25 | 30 days supply | Qty: 30 | Fill #0

## 2018-08-08 ENCOUNTER — Other Ambulatory Visit: Payer: Self-pay

## 2018-08-08 ENCOUNTER — Ambulatory Visit: Payer: No Typology Code available for payment source | Admitting: Internal Medicine

## 2018-08-08 MED ORDER — SPIRONOLACTONE 25 MG PO TABS: 25.0000 mg | ORAL_TABLET | Freq: Every day | ORAL | 4 refills | Status: DC

## 2018-08-16 ENCOUNTER — Other Ambulatory Visit: Payer: Self-pay | Admitting: Internal Medicine

## 2018-08-16 DIAGNOSIS — Z1231 Encounter for screening mammogram for malignant neoplasm of breast: Secondary | ICD-10-CM

## 2018-09-02 ENCOUNTER — Encounter: Payer: Self-pay | Admitting: Internal Medicine

## 2018-09-03 ENCOUNTER — Other Ambulatory Visit: Payer: Self-pay

## 2018-09-03 MED ORDER — SPIRONOLACTONE 25 MG PO TABS: 25.0000 mg | ORAL_TABLET | Freq: Every day | ORAL | 4 refills | Status: DC

## 2018-09-03 MED FILL — SPIRONOLACTONE 25 MG TABLET: 25 | 30 days supply | Qty: 30 | Fill #0

## 2018-09-27 ENCOUNTER — Ambulatory Visit: Payer: No Typology Code available for payment source

## 2018-09-30 ENCOUNTER — Ambulatory Visit (INDEPENDENT_AMBULATORY_CARE_PROVIDER_SITE_OTHER): Payer: No Typology Code available for payment source | Admitting: Internal Medicine

## 2018-09-30 ENCOUNTER — Encounter: Payer: Self-pay | Admitting: Internal Medicine

## 2018-09-30 VITALS — BP 132/74 | HR 86 | Temp 97.9°F | Ht 63.0 in | Wt 206.2 lb

## 2018-09-30 DIAGNOSIS — R51 Headache: Secondary | ICD-10-CM

## 2018-09-30 DIAGNOSIS — Z6836 Body mass index (BMI) 36.0-36.9, adult: Secondary | ICD-10-CM

## 2018-09-30 DIAGNOSIS — R519 Headache, unspecified: Secondary | ICD-10-CM

## 2018-09-30 DIAGNOSIS — I10 Essential (primary) hypertension: Secondary | ICD-10-CM

## 2018-09-30 DIAGNOSIS — R0683 Snoring: Secondary | ICD-10-CM | POA: Diagnosis not present

## 2018-09-30 MED ORDER — MAGNESIUM 400 MG PO CAPS: 400.0000 mg | ORAL_CAPSULE | Freq: Every evening | ORAL | 1 refills | Status: DC

## 2018-09-30 MED ORDER — CYCLOBENZAPRINE HCL 10 MG PO TABS: 10.0000 mg | ORAL_TABLET | Freq: Every day | ORAL | 0 refills | Status: DC

## 2018-09-30 MED FILL — CYCLOBENZAPRINE 10 MG TAB: 10 | 30 days supply | Qty: 30 | Fill #0

## 2018-09-30 NOTE — Progress Notes (Signed)
Subjective:     Patient ID: [name] , female    DOB: 1966/05/21 , 53 y.o.   MRN: [id number]   Chief Complaint  Patient presents with  . Migraine    HPI  Migraine   This is a recurrent problem. The current episode started 1 to 4 weeks ago. The problem occurs intermittently. The problem has been gradually worsening. The pain is located in the temporal and frontal region. The pain does not radiate. The quality of the pain is described as aching, dull and throbbing. The pain is at a severity of 5/10. The pain is moderate. Pertinent negatives include no blurred vision, dizziness, hearing loss, phonophobia or photophobia. She has tried NSAIDs for the symptoms. The treatment provided no relief.   She has not been able to identify any triggers.   Past Medical History:  Diagnosis Date  . Asthma    no inhaler  . Hypertension   . PONV (postoperative nausea and vomiting) 2006     Family History  Problem Relation Age of Onset  . Hypertension Mother   . Alcohol abuse Father      Current Outpatient Medications:  .  Albuterol Sulfate (PROAIR RESPICLICK) 505 (90 Base) MCG/ACT AEPB, Inhale 2 puffs into the lungs every 4 (four) hours as needed., Disp: 1 each, Rfl: 5 .  fluticasone (FLONASE) 50 MCG/ACT nasal spray, Place 1 spray into both nostrils daily., Disp: 16 g, Rfl: 0 .  spironolactone (ALDACTONE) 25 MG tablet, Take 1 tablet (25 mg total) by mouth daily., Disp: 30 tablet, Rfl: 4 .  Vitamin D, Cholecalciferol, 1000 units CAPS, Take 1,000 mg by mouth daily., Disp: , Rfl:  .  cyclobenzaprine (FLEXERIL) 10 MG tablet, Take 1 tablet (10 mg total) by mouth at bedtime. Please add prn, Disp: 30 tablet, Rfl: 0 .  Magnesium 400 MG CAPS, Take 400 mg by mouth Nightly., Disp: 30 capsule, Rfl: 1 .  olmesartan (BENICAR) 20 MG tablet, Take 1 tablet (20 mg total) by mouth daily., Disp: 90 tablet, Rfl: 1   Allergies  Allergen Reactions  . Peanut-Containing Drug Products Itching  . Shellfish  Allergy Hives and Itching     Review of Systems  Constitutional: Negative.   HENT: Negative for hearing loss.   Eyes: Negative for blurred vision and photophobia.  Respiratory: Negative.   Cardiovascular: Negative.   Gastrointestinal: Negative.   Neurological: Positive for headaches. Negative for dizziness.  Psychiatric/Behavioral: Negative.      Today's Vitals   09/30/18 1507  BP: 132/74  Pulse: 86  Temp: 97.9 F (36.6 C)  TempSrc: Oral  Weight: 206 lb 3.2 oz (93.5 kg)  Height: 5\' 3"  (1.6 m)  PainSc: 0-No pain   Body mass index is 36.53 kg/m.   Objective:  Physical Exam Vitals signs and nursing note reviewed.  Constitutional:      Appearance: Normal appearance.  HENT:     Head: Normocephalic and atraumatic.  Cardiovascular:     Rate and Rhythm: Normal rate and regular rhythm.     Heart sounds: Normal heart sounds.  Pulmonary:     Effort: Pulmonary effort is normal.     Breath sounds: Normal breath sounds.  Skin:    General: Skin is warm.  Neurological:     General: No focal deficit present.     Mental Status: She is alert.  Psychiatric:        Mood and Affect: Mood normal.        Behavior: Behavior normal.  Assessment And Plan:     1. Nonintractable episodic headache, unspecified headache type  She is encouraged to start magnesium supplementation nightly. She will let me know if her sx persist.   - Ambulatory referral to Neurology  2. Snoring  I will refer her for a sleep study. Risks associated with untreated OSA was discussed with the patient.  She is in agreement to her treatment plan.   - Ambulatory referral to Neurology  3. Essential hypertension, benign  Controlled. She is aware that optimal bp is less than 130/80. She is encouraged to avoid adding salt to her foods and to incorporate more exercise into her daily routine.   4. Class 2 severe obesity due to excess calories with serious comorbidity and body mass index (BMI) of 36.0 to  36.9 in adult [facility])  Importance of achieving optimal weight to decrease risk of cardiovascular disease and cancers was discussed with the patient in full detail. She is encouraged to start slowly - start with 10 minutes twice daily at least three to four days per week and to gradually build to 30 minutes five days weekly. She was given tips to incorporate more activity into her daily routine - take stairs when possible, park farther away from her job, grocery stores, etc.    Maximino Greenland, MD

## 2018-10-02 ENCOUNTER — Encounter: Payer: Self-pay | Admitting: Internal Medicine

## 2018-10-03 ENCOUNTER — Other Ambulatory Visit: Payer: Self-pay

## 2018-10-03 MED ORDER — OLMESARTAN MEDOXOMIL 20 MG PO TABS: 20.0000 mg | ORAL_TABLET | Freq: Every day | ORAL | 1 refills | Status: DC

## 2018-10-03 MED ORDER — SPIRONOLACTONE 25 MG PO TABS: 25.0000 mg | ORAL_TABLET | Freq: Every day | ORAL | 1 refills | Status: DC

## 2018-10-03 MED FILL — OLMESARTAN MEDOXOMIL 20 MG: 20 | 90 days supply | Qty: 90 | Fill #0

## 2018-10-03 MED FILL — SPIRONOLACTONE 25 MG TABLET: 25 | 90 days supply | Qty: 90 | Fill #0

## 2018-10-04 ENCOUNTER — Encounter: Payer: Self-pay | Admitting: Internal Medicine

## 2018-10-07 ENCOUNTER — Encounter: Payer: Self-pay | Admitting: Internal Medicine

## 2018-10-07 ENCOUNTER — Telehealth: Payer: No Typology Code available for payment source | Admitting: Family

## 2018-10-07 DIAGNOSIS — J019 Acute sinusitis, unspecified: Secondary | ICD-10-CM

## 2018-10-07 DIAGNOSIS — B9689 Other specified bacterial agents as the cause of diseases classified elsewhere: Secondary | ICD-10-CM

## 2018-10-07 MED ORDER — AMOXICILLIN-POT CLAVULANATE 875-125 MG PO TABS: 1.0000 | ORAL_TABLET | Freq: Two times a day (BID) | ORAL | 0 refills | Status: DC

## 2018-10-07 MED FILL — AMOX-CLAV 875-125 MG TABLET: 875-125 | 7 days supply | Qty: 14 | Fill #0

## 2018-10-07 NOTE — Progress Notes (Signed)

## 2018-10-09 ENCOUNTER — Other Ambulatory Visit: Payer: Self-pay | Admitting: Internal Medicine

## 2018-10-09 DIAGNOSIS — Z1231 Encounter for screening mammogram for malignant neoplasm of breast: Secondary | ICD-10-CM

## 2018-10-16 ENCOUNTER — Other Ambulatory Visit: Payer: Self-pay | Admitting: Internal Medicine

## 2018-10-18 ENCOUNTER — Ambulatory Visit
Admission: RE | Admit: 2018-10-18 | Discharge: 2018-10-18 | Disposition: A | Discharge status: Home / Self Care | Payer: No Typology Code available for payment source | Source: Ambulatory Visit

## 2018-10-18 ENCOUNTER — Other Ambulatory Visit: Payer: Self-pay

## 2018-10-18 DIAGNOSIS — Z1231 Encounter for screening mammogram for malignant neoplasm of breast: Secondary | ICD-10-CM

## 2018-10-22 ENCOUNTER — Other Ambulatory Visit: Payer: Self-pay | Admitting: Internal Medicine

## 2018-10-22 DIAGNOSIS — R928 Other abnormal and inconclusive findings on diagnostic imaging of breast: Secondary | ICD-10-CM

## 2018-10-24 ENCOUNTER — Encounter: Payer: Self-pay | Admitting: Internal Medicine

## 2018-10-31 ENCOUNTER — Encounter: Payer: Self-pay | Admitting: Internal Medicine

## 2018-11-01 ENCOUNTER — Encounter: Payer: Self-pay | Admitting: Internal Medicine

## 2018-11-01 ENCOUNTER — Telehealth: Payer: No Typology Code available for payment source | Admitting: Nurse Practitioner

## 2018-11-01 DIAGNOSIS — Z20822 Contact with and (suspected) exposure to covid-19: Secondary | ICD-10-CM

## 2018-11-01 DIAGNOSIS — R6889 Other general symptoms and signs: Principal | ICD-10-CM

## 2018-11-01 MED ORDER — BENZONATATE 100 MG PO CAPS: 100.0000 mg | ORAL_CAPSULE | Freq: Three times a day (TID) | ORAL | 0 refills | Status: DC | PRN

## 2018-11-01 NOTE — Progress Notes (Signed)
E-Visit for Corona Virus Screening  Based on your current symptoms, you may very well have the virus, however your symptoms are mild. Currently, not all patients are being tested. If the symptoms are mild and there is not a known exposure, performing the test is not indicated.  Coronavirus disease 2019 (COVID-19) is a respiratory illness that can spread from person to person. The virus that causes COVID-19 is a new virus that was first identified in the country of [place] but is now found in multiple other countries and has spread to the [place].  Symptoms associated with the virus are mild to severe fever, cough, and shortness of breath. There is currently no vaccine to protect against COVID-19, and there is no specific antiviral treatment for the virus.   To be considered HIGH RISK for Coronavirus (COVID-19), you have to meet the following criteria:  . Traveled to [place], [place], [place], [place] or [place]; or in the [place] to [place], [place], [place], or [place]; and have fever, cough, and shortness of breath within the last 2 weeks of travel OR  . Been in close contact with a person diagnosed with COVID-19 within the last 2 weeks and have fever, cough, and shortness of breath  . IF YOU DO NOT MEET THESE CRITERIA, YOU ARE CONSIDERED LOW RISK FOR COVID-19.   It is vitally important that if you feel that you have an infection such as this virus or any other virus that you stay home and away from places where you may spread it to others.  You should self-quarantine for 14 days if you have symptoms that could potentially be coronavirus and avoid contact with people age 65 and older.   You can use medication such as A prescription cough medication called Tessalon Perles 100 mg. You may take 1-2 capsules every 8 hours as needed for cough  You may also take acetaminophen (Tylenol) as needed for fever.   Reduce your risk of any infection by using the same precautions used for  avoiding the common cold or flu:  . Wash your hands often with soap and warm water for at least 20 seconds.  If soap and water are not readily available, use an alcohol-based hand sanitizer with at least 60% alcohol.  . If coughing or sneezing, cover your mouth and nose by coughing or sneezing into the elbow areas of your shirt or coat, into a tissue or into your sleeve (not your hands). . Avoid shaking hands with others and consider head nods or verbal greetings only. . Avoid touching your eyes, nose, or mouth with unwashed hands.  . Avoid close contact with people who are sick. . Avoid places or events with large numbers of people in one location, like concerts or sporting events. . Carefully consider travel plans you have or are making. . If you are planning any travel outside or inside the [place], visit the CDC's Travelers' Health webpage for the latest health notices. . If you have some symptoms but not all symptoms, continue to monitor at home and seek medical attention if your symptoms worsen. . If you are having a medical emergency, call 911.  HOME CARE . Only take medications as instructed by your medical team. . Drink plenty of fluids and get plenty of rest. . A steam or ultrasonic humidifier can help if you have congestion.   GET HELP RIGHT AWAY IF: . You develop worsening fever. . You become short of breath . You cough   up blood. . Your symptoms become more severe MAKE SURE YOU   Understand these instructions.  Will watch your condition.  Will get help right away if you are not doing well or get worse.  Your e-visit answers were reviewed by a board certified advanced clinical practitioner to complete your personal care plan.  Depending on the condition, your plan could have included both over the counter or prescription medications.  If there is a problem please reply once you have received a response from your provider. Your safety is important to us.  If you have drug  allergies check your prescription carefully.    You can use MyChart to ask questions about today's visit, request a non-urgent call back, or ask for a work or school excuse for 24 hours related to this e-Visit. If it has been greater than 24 hours you will need to follow up with your provider, or enter a new e-Visit to address those concerns. You will get an e-mail in the next two days asking about your experience.  I hope that your e-visit has been valuable and will speed your recovery. Thank you for using e-visits.  5 minutes spent reviewing and documenting in chart.  

## 2018-11-04 ENCOUNTER — Encounter: Payer: Self-pay | Admitting: Internal Medicine

## 2018-11-06 ENCOUNTER — Other Ambulatory Visit: Payer: No Typology Code available for payment source

## 2018-11-06 ENCOUNTER — Encounter: Payer: Self-pay | Admitting: Internal Medicine

## 2018-11-06 HISTORY — PX: BREAST BIOPSY: SHX20

## 2018-11-07 ENCOUNTER — Telehealth: Payer: No Typology Code available for payment source | Admitting: Physician Assistant

## 2018-11-07 ENCOUNTER — Other Ambulatory Visit: Payer: Self-pay | Admitting: Physician Assistant

## 2018-11-07 DIAGNOSIS — R300 Dysuria: Secondary | ICD-10-CM

## 2018-11-07 MED ORDER — CEPHALEXIN 500 MG PO CAPS: 500.0000 mg | ORAL_CAPSULE | Freq: Two times a day (BID) | ORAL | 0 refills | Status: AC

## 2018-11-07 NOTE — Progress Notes (Signed)
I have spent 5 minutes in review of e-visit questionnaire, review and updating patient chart, medical decision making and response to patient.   Adeena Bernabe Cody Karmelo Bass, PA-C    

## 2018-11-07 NOTE — Progress Notes (Signed)

## 2018-11-08 ENCOUNTER — Encounter: Payer: Self-pay | Admitting: Internal Medicine

## 2018-11-08 MED FILL — CEPHALEXIN 500 MG CAPSULE: 500 | 7 days supply | Qty: 14 | Fill #0

## 2018-11-08 MED FILL — BENZONATATE 100 MG CAP: 100 | 7 days supply | Qty: 20 | Fill #0

## 2018-11-21 ENCOUNTER — Telehealth: Payer: Self-pay | Admitting: Neurology

## 2018-11-21 NOTE — Telephone Encounter (Signed)
Due to current COVID 19 pandemic, our office is severely reducing in office visits until further notice, in order to minimize the risk to our patients and healthcare providers.   Called patient today to offer her a virtual visit for her 12/03/18 appointment. 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 I advised patient to check her inbox for the e-mail from [place] regarding the Webex appointment. Patient verbalized understanding.  Pt understands that although there may be some limitations with this type of visit, we will take all precautions to reduce any security or privacy concerns.  Pt understands that this will be treated like an in office visit and we will file with pt's insurance, and there may be a patient responsible charge related to this service.  Pt's email is [email or web address]. Pt understands that the [employer] webex software must be downloaded and operational on the device pt plans to use for the visit.

## 2018-11-22 ENCOUNTER — Other Ambulatory Visit: Payer: Self-pay

## 2018-11-22 ENCOUNTER — Ambulatory Visit
Admission: RE | Admit: 2018-11-22 | Discharge: 2018-11-22 | Disposition: A | Discharge status: Home / Self Care | Payer: No Typology Code available for payment source | Source: Ambulatory Visit | Attending: Internal Medicine | Admitting: Internal Medicine

## 2018-11-22 ENCOUNTER — Other Ambulatory Visit: Payer: Self-pay | Admitting: Internal Medicine

## 2018-11-22 DIAGNOSIS — R928 Other abnormal and inconclusive findings on diagnostic imaging of breast: Secondary | ICD-10-CM

## 2018-11-22 DIAGNOSIS — N6310 Unspecified lump in the right breast, unspecified quadrant: Secondary | ICD-10-CM

## 2018-11-26 ENCOUNTER — Telehealth: Payer: Self-pay

## 2018-11-26 ENCOUNTER — Encounter: Payer: Self-pay | Admitting: Internal Medicine

## 2018-11-26 ENCOUNTER — Telehealth: Payer: No Typology Code available for payment source | Admitting: Physician Assistant

## 2018-11-26 DIAGNOSIS — R300 Dysuria: Secondary | ICD-10-CM

## 2018-11-26 NOTE — Progress Notes (Signed)
Based on what you shared with me, I feel your condition warrants further evaluation and I recommend that you be seen for a face to face office visit.     NOTE: If you entered your credit card information for this eVisit, you will not be charged. You may see a "hold" on your card for the $35 but that hold will drop off and you will not have a charge processed.  If you are having a true medical emergency please call 911.  If you need an urgent face to face visit, [facility] has four urgent care centers for your convenience.    PLEASE NOTE: THE [facility] LOCATIONS AND URGENT CARE CLINICS DO NOT HAVE THE TESTING FOR CORONAVIRUS COVID19 AVAILABLE.  IF YOU FEEL YOU NEED THIS TEST YOU MUST GO TO A TRIAGE LOCATION AT [facility] ?  [email or web address] to reserve your spot online an avoid wait times  [facility] [address] [place], [place] [address] Modified hours of operation: Monday-Friday, 10 AM to 6 PM  Saturday & Sunday 10 AM to 4 PM *Across the street from [place] (New Address!) [address] [place], [place] [address] *Just off [address], across the road from [place] hours of operation: Monday-Friday, 10 AM to 5 PM  Closed Saturday & Sunday   The following sites will take your insurance:  [facility] a Provider at this Location  [place], [place] [address] 10 am to 8 pm Monday-Friday 12 pm to 8 pm [facility] a Provider at this Location  [place], [place], [place] [address] 8 am to 8 pm Monday-Friday 9 am to 6 pm Saturday 11 am to 6 pm Sunday   [facility]  [phone] Get Driving Directions  [address] [place], [place] [address] 8 am to 8 pm Monday-Friday 8 am to 4 pm Saturday-Sunday   Your e-visit answers were reviewed by a board certified advanced clinical practitioner to complete your personal care plan.  Thank you for using e-Visits.  ===View-only below this line===   ----- Message -----    From: [name]    Sent: 11/26/2018 10:32 AM EDT      To: E-Visit Mailing List Subject: E-Visit Submission: Urinary Problems  E-Visit Submission: Urinary Problems --------------------------------  Question: Which of the following are you experiencing? Answer:   Pain while passing urine            Change in urine appearance or smell  Question: When you have pain when passing urine, which of these apply? Answer:   The pain feels like it is on the inside  Question: Are you able to pass urine? Answer:   Yes, I can pass urine.  Question: How long have you had pain or difficulty passing urine? Answer:   Two days or less  Question: Do you have a fever? Answer:   No, I do not have a fever  Question: Do you have any of the following? Answer:   I have none of these problems  Question: Do you have an exaggerated sensation of the need to pass urine? Answer:   Yes, the sensation is exaggerated  Question: Do you have the urge to urinate more of less frequently than normal?  Answer:   More frequently  Question: What does your urine look like? Answer:   I am not sure  Question: Do you have any of the following? Answer:   An unusual smell  Question: Do you have any of the following? Answer:   No discharge  Question: Do you have any sores on your genitals? Answer:   No  Question: Do you have any history of kidney dysfunction or kidney problems? Answer:   No  Question: Within the past 3 months, have you had any surgery on your kidneys or bladder, or have you had a tube inserted to collect your urine? Answer:   Yes, I have had one or both  Question: Have you  had similar symptoms in the past? Answer:   Yes, I have had similar symptoms before  Question: If you had similar symptoms in the past, did any of the following work? Answer:   Pills for urine infection  Question: Please list any additional comments  Answer:   I just recently finished a antibiotic for a UTI a few weeks ago and now the symptoms are back again.  Question: Please list your medication allergies that you may have ? (If 'none' , please list as 'none') Answer:   Allergic to shell fish/tree nuts

## 2018-11-26 NOTE — Telephone Encounter (Signed)
The pt was called and told that it's a noon appt time for tomorrow with the np and that she could come then to be evaluated for a UTI.  The pt said she could do the appt and would take a early

## 2018-11-27 ENCOUNTER — Encounter: Payer: Self-pay | Admitting: Nurse Practitioner

## 2018-11-27 ENCOUNTER — Ambulatory Visit (INDEPENDENT_AMBULATORY_CARE_PROVIDER_SITE_OTHER): Payer: No Typology Code available for payment source | Admitting: Nurse Practitioner

## 2018-11-27 ENCOUNTER — Encounter: Payer: Self-pay | Admitting: Neurology

## 2018-11-27 ENCOUNTER — Other Ambulatory Visit: Payer: Self-pay

## 2018-11-27 VITALS — BP 116/74 | HR 80 | Temp 98.4°F | Ht 63.0 in | Wt 202.2 lb

## 2018-11-27 DIAGNOSIS — N898 Other specified noninflammatory disorders of vagina: Secondary | ICD-10-CM

## 2018-11-27 MED ORDER — CIPROFLOXACIN HCL 500 MG PO TABS: 500.0000 mg | ORAL_TABLET | Freq: Two times a day (BID) | ORAL | 0 refills | Status: AC

## 2018-11-27 MED FILL — CIPROFLOXACIN HCL 500 MG TA: 500 | 10 days supply | Qty: 20 | Fill #0

## 2018-11-27 NOTE — Telephone Encounter (Signed)
Called the patient to review the pt chart. LVM informing I would send a mychart message. Instructed the patient to call if she has questions.

## 2018-11-27 NOTE — Progress Notes (Signed)
  Subjective:     Patient ID: [name] , female    DOB: 11-Aug-1965 , 53 y.o.   MRN: [id number]   Chief Complaint  Patient presents with  . Urinary Tract Infection    HPI  Treated for a UTI 2 weeks ago, has been off medications for 1 weeks and symptoms reoccurred.    She goes to [facility] - for bladder evaluation  Dysuria   This is a recurrent problem. The current episode started in the past 7 days. The problem occurs every urination. The problem has been gradually worsening. The quality of the pain is described as aching and burning. The pain is at a severity of 8/10. There has been no fever. She is not sexually active. There is no history of pyelonephritis. Pertinent negatives include no chills or hematuria. She has tried antibiotics for the symptoms. The treatment provided no relief. Her past medical history is significant for recurrent UTIs (treated with cephalexin ).     Past Medical History:  Diagnosis Date  . Asthma    no inhaler  . Hypertension   . PONV (postoperative nausea and vomiting) 2006     Family History  Problem Relation Age of Onset  . Hypertension Mother   . Alcohol abuse Father      Current Outpatient Medications:  .  Albuterol Sulfate (PROAIR RESPICLICK) 742 (90 Base) MCG/ACT AEPB, Inhale 2 puffs into the lungs every 4 (four) hours as needed., Disp: 1 each, Rfl: 5 .  cyclobenzaprine (FLEXERIL) 10 MG tablet, Take 1 tablet (10 mg total) by mouth at bedtime. Please add prn, Disp: 30 tablet, Rfl: 0 .  fluticasone (FLONASE) 50 MCG/ACT nasal spray, Place 1 spray into both nostrils daily., Disp: 16 g, Rfl: 0 .  Magnesium 400 MG CAPS, Take 400 mg by mouth Nightly., Disp: 30 capsule, Rfl: 1 .  olmesartan (BENICAR) 20 MG tablet, Take 1 tablet (20 mg total) by mouth daily., Disp: 90 tablet, Rfl: 1 .  spironolactone (ALDACTONE) 25 MG tablet, Take 1 tablet (25 mg total) by mouth daily., Disp: 90 tablet, Rfl: 1 .  Vitamin D, Cholecalciferol, 1000  units CAPS, Take 1,000 mg by mouth daily., Disp: , Rfl:    Allergies  Allergen Reactions  . Peanut-Containing Drug Products Itching  . Shellfish Allergy Hives and Itching     Review of Systems  Constitutional: Negative for chills.  Genitourinary: Positive for dysuria. Negative for hematuria.     Today's Vitals   11/27/18 1159  BP: 116/74  Pulse: 80  Temp: 98.4 F (36.9 C)  TempSrc: Oral  Weight: 202 lb 3.2 oz (91.7 kg)  Height: 5\' 3"  (1.6 m)   Body mass index is 35.82 kg/m.   Objective:  Physical Exam      Assessment And Plan:     1. Leukorrhea  Large white cells will treat with cipro   Encouraged to increase water intake and can drink cranberry juice  Once culture comes will change antibiotic if necessary - Culture, Urine - ciprofloxacin (CIPRO) 500 MG tablet; Take 1 tablet (500 mg total) by mouth 2 (two) times daily for 10 days.  Dispense: 20 tablet; Refill: 0     Minette Brine, FNP    THE PATIENT IS ENCOURAGED TO PRACTICE SOCIAL DISTANCING DUE TO THE COVID-19 PANDEMIC.

## 2018-11-28 ENCOUNTER — Other Ambulatory Visit: Payer: Self-pay | Admitting: Internal Medicine

## 2018-11-29 LAB — URINE CULTURE

## 2018-12-02 ENCOUNTER — Other Ambulatory Visit: Payer: Self-pay

## 2018-12-02 ENCOUNTER — Ambulatory Visit
Admission: RE | Admit: 2018-12-02 | Discharge: 2018-12-02 | Disposition: A | Discharge status: Home / Self Care | Payer: No Typology Code available for payment source | Source: Ambulatory Visit | Attending: Internal Medicine | Admitting: Internal Medicine

## 2018-12-02 ENCOUNTER — Ambulatory Visit: Payer: No Typology Code available for payment source | Admitting: Internal Medicine

## 2018-12-02 ENCOUNTER — Encounter: Payer: Self-pay | Admitting: Nurse Practitioner

## 2018-12-02 ENCOUNTER — Encounter: Payer: Self-pay | Admitting: Internal Medicine

## 2018-12-02 DIAGNOSIS — N6310 Unspecified lump in the right breast, unspecified quadrant: Secondary | ICD-10-CM

## 2018-12-03 ENCOUNTER — Ambulatory Visit: Payer: No Typology Code available for payment source | Admitting: Internal Medicine

## 2018-12-03 ENCOUNTER — Ambulatory Visit (INDEPENDENT_AMBULATORY_CARE_PROVIDER_SITE_OTHER): Payer: No Typology Code available for payment source | Admitting: Neurology

## 2018-12-03 ENCOUNTER — Encounter: Payer: Self-pay | Admitting: Neurology

## 2018-12-03 DIAGNOSIS — R51 Headache: Secondary | ICD-10-CM

## 2018-12-03 DIAGNOSIS — R0683 Snoring: Secondary | ICD-10-CM

## 2018-12-03 DIAGNOSIS — I10 Essential (primary) hypertension: Secondary | ICD-10-CM | POA: Diagnosis not present

## 2018-12-03 DIAGNOSIS — R519 Headache, unspecified: Secondary | ICD-10-CM

## 2018-12-03 DIAGNOSIS — J4521 Mild intermittent asthma with (acute) exacerbation: Secondary | ICD-10-CM

## 2018-12-03 NOTE — Patient Instructions (Signed)

## 2018-12-03 NOTE — Progress Notes (Signed)
Virtual Visit via Video Note  I connected with [name] on 12/03/18 at 10:00 AM EDT by a video enabled telemedicine application and verified that I am speaking with the correct person using two identifiers.   I discussed the limitations of evaluation and management by telemedicine and the availability of in person appointments. The patient expressed understanding and agreed to proceed.   [name], [place]   Provider:  Larey Hall, M D  Primary Care Physician:  Shelby Chard, MD   Referring Provider: [place] Chard, MD   Chief complaint according to patient : " my husband says I snore, my mother confirmed that", and " I have a new form of headache in the morning". 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  The environment has not changed her sleep related morning headaches.    HPI:  [name] is a 53 y.o. female Patient and seen here in a Video -Consultation upon referral from [name].  She reports of recurrent headache problem is the most recent episode starting in mid February.  She describes the quality of the pain as aching, dull and throbbing and not that severe she rated it at 5 out of 10 , [name] encouraged her to try magnesium at night and she referred her because the patient also stated that she was snoring and she wants to make sure that this is not a case of obstructive sleep apnea related morning headaches.  The patient is in the class II obesity severity with a BMI between 36 and 37, and she is suffering from essential hypertension. In our telephone and video interview the patient stated that her headaches are migrainous they concentrate on the forehead they appear in the  morning they are present when she wakes up but they do not wake her up from sleep. She does not experience: Nausea, photophobia or phonophobia.  Sleep and medical history:  Asthma, morning headaches, migraine headaches. See above   Family medical-/ sleep history: The patient is unaware of a first-degree relative being diagnosed with sleep apnea.  She was not even sure if her parents ever be snored but her sister has complained about restless leg symptoms.   Social history: The patient is currently working from home  ( Covid )she is married and currently her son 38 is living with them she also has a Psychiatrist that has a new baby and lives in the same house.  She states she has multiple stepchildren and 18 step grandchildren.  She exercises 3 times weekly keeps a heart healthy diet with low sodium.  She drinks 1 cup of coffee in the morning she does not use sodas, she does not use alcohol and she does not use any form of tobacco.  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  However she is going to her bedroom at 930 where she is watching movies and TV.  Once she is asleep she stops snoring.  She is not quite sure how long she actually stays awake with the TV  in the background but it may be 30 minutes.  She prefers to sleep on her sides she uses only one pillow for head support and she sleeps on a flat non-adjustable bed.  She has one bathroom break each night and she goes back to sleep usually promptly doing her normal work hours she has to rise at 6 AM and is woken by an alarm.  She usually feels rested and restored in the morning.  She does not endorse any restless legs the snoring has been more bothersome to her husband.   Review of Systems: Out of a complete 14 system review, the patient complains of only the  following symptoms, and all other reviewed systems are negative. How likely are you to doze in the following situations: 0 = not likely, 1 = slight chance, 2 = moderate chance, 3 = high chance  Sitting and Reading? Watching Television? Sitting inactive in a public place (theater or meeting)? Lying down in the afternoon when circumstances permit? Sitting and talking to someone? Sitting quietly after lunch without alcohol? In a car, while stopped for a few minutes in traffic? As a passenger in a car for an hour without a break?  Total = 6/ 24  , Fatigue severity score N/A  , depression score N/A    Social History   Socioeconomic History  . Marital status: Married    Spouse name: Not on file  . Number of children: Not on file  . Years of education: Not on file  . Highest education level: Not on file  Occupational History  . Not on file  Social Needs  . Financial resource strain: Not on file  . Food insecurity:    Worry: Not on file    Inability: Not on file  . Transportation needs:    Medical: Not on file    Non-medical: Not on file  Tobacco Use  . Smoking status: Former Smoker    Packs/day: 0.50    Years: 33.00    Pack years: 16.50    Types: Cigarettes    Last attempt to quit: 05/04/2012    Years since quitting: 6.5  . Smokeless tobacco: Never Used  Substance and Sexual Activity  . Alcohol use: No  . Drug use: No  . Sexual activity: Never  Lifestyle  . Physical activity:    Days per week: Not on file    Minutes per session: Not on file  . Stress: Not on file  Relationships  . Social connections:    Talks on phone: Not on file    Gets together: Not on file    Attends religious service: Not on file    Active member of club or organization: Not on file    Attends meetings of clubs or organizations: Not on file    Relationship status: Not on file  . Intimate partner violence:    Fear of current or ex partner: Not on file    Emotionally abused: Not on file     Physically abused: Not on file    Forced sexual activity: Not on file  Other Topics Concern  . Not on file  Social History Narrative  . Not on file    Family History  Problem Relation Age of Onset  . Hypertension Mother   . Alcohol abuse Father     Past Medical History:  Diagnosis Date  . Asthma    no inhaler  . Hypertension   . PONV (postoperative nausea and vomiting) 2006  Past Surgical History:  Procedure Laterality Date  . CESAREAN SECTION  1995  . HYSTEROSCOPY N/A 12/13/2012   Procedure: HYSTEROSCOPY/IUD REMOVAL;  Surgeon: Marvene Staff, MD;  Location: [facility];  Service: Gynecology;  Laterality: N/A;  . TUBAL LIGATION  2006    Current Outpatient Medications  Medication Sig Dispense Refill  . Albuterol Sulfate (PROAIR RESPICLICK) 209 (90 Base) MCG/ACT AEPB Inhale 2 puffs into the lungs every 4 (four) hours as needed. 1 each 5  . ciprofloxacin (CIPRO) 500 MG tablet Take 1 tablet (500 mg total) by mouth 2 (two) times daily for 10 days. 20 tablet 0  . cyclobenzaprine (FLEXERIL) 10 MG tablet Take 1 tablet (10 mg total) by mouth at bedtime. Please add prn 30 tablet 0  . fluticasone (FLONASE) 50 MCG/ACT nasal spray Place 1 spray into both nostrils daily. 16 g 0  . Magnesium 400 MG CAPS Take 400 mg by mouth Nightly. 30 capsule 1  . olmesartan (BENICAR) 20 MG tablet Take 1 tablet (20 mg total) by mouth daily. 90 tablet 1  . spironolactone (ALDACTONE) 25 MG tablet Take 1 tablet (25 mg total) by mouth daily. 90 tablet 1  . Vitamin D, Cholecalciferol, 1000 units CAPS Take 1,000 mg by mouth daily.     No current facility-administered medications for this visit.     Allergies as of 12/03/2018 - Review Complete 11/27/2018  Allergen Reaction Noted  . Peanut-containing drug products Itching 03/19/2017  . Shellfish allergy Hives and Itching 03/26/2012    Vitals: There were no vitals taken for this visit. Last Weight:  Wt Readings from Last 1 Encounters:  11/27/18 202 lb  3.2 oz (91.7 kg)   OBS:JGGEZ is no height or weight on file to calculate BMI.       Last Height:   Ht Readings from Last 1 Encounters:  11/27/18 5\' 3"  (1.6 m)    Observations/Objective:  General: The patient is awake, alert and appears not in acute distress. The patient is well groomed. Head: Normocephalic, atraumatic.  Neck is supple. Mallampati 3,  neck circumference: 15*. Nasal airflow patent ,   Cardiovascular:  Deferred/ Respiratory: patient is able to hold her breath for 888erect Skin:  Without evidence of  Facial edema, or rash Trunk: the patient is 5'3" and weights 200 # The patient's posture is erect.    Neurologic exam : The patient is awake and alert, oriented to place and time.    Attention span & concentration ability appears normal.  Speech is fluent,  without  dysarthria, dysphonia or aphasia.  Mood and affect are appropriate.  Cranial nerves: Pupils are equal .  Extraocular movements in vertical and horizontal planes intact.  Facial motor strength is symmetric and tongue and uvula moved in  midline. Shoulder shrug was symmetrical.   Motor exam:   muscle bulk and ROM were symmetric in all extremities. No pronator drift   Coordination: Aalternating movements in the fingers/hands were  normal without evidence of ataxia, dysmetria or tremor.  Gait and station: Patient walks without assistive device .  Assessment and Plan:   Mrs. Deol has not been witnessed to have apnea, only snoring.  She has been snoring since teenage.  Her sleep is refreshing and restorative but very recently she developed headaches in the morning, these new headaches don't wake her - but she wakes with them.   We will screen for OSA by HST. Depending on the findings , we may initiate treatment by CPAP, dental device or none  at all.   There is another longstanding history of migraine headaches which seem to be triggered in her work environment.    Follow Up Instructions: HST, follow  up in 3 month.     I discussed the assessment and treatment plan with the patient. The patient was provided an opportunity to ask questions and all were answered. The patient agreed with the plan and demonstrated an understanding of the instructions.   The patient was advised to call back or seek an in-person evaluation if the symptoms worsen or if the condition fails to improve as anticipated.  I provided 18 minutes of non-face-to-face time during this encounter.   Shelby Seat, MD 9/70/2637, 85:88 AM  Certified in Neurology by ABPN Certified in [place] by [facility] [address], [place], [place] [address]

## 2018-12-05 ENCOUNTER — Encounter: Payer: Self-pay | Admitting: Nurse Practitioner

## 2018-12-09 ENCOUNTER — Encounter: Payer: Self-pay | Admitting: Internal Medicine

## 2018-12-14 NOTE — Progress Notes (Signed)
Greater than 5 minutes, yet less than 10 minutes of time have been spent researching, coordinating, and implementing care for this patient today.  Thank you for the details you included in the comment boxes. Those details are very helpful in determining the best course of treatment for you and help us to provide the best care.  

## 2018-12-16 ENCOUNTER — Encounter: Payer: Self-pay | Admitting: Internal Medicine

## 2018-12-17 ENCOUNTER — Telehealth: Payer: Self-pay

## 2018-12-17 ENCOUNTER — Encounter: Payer: Self-pay | Admitting: Internal Medicine

## 2018-12-17 NOTE — Telephone Encounter (Signed)
Verbal consent given for virtual visit  

## 2018-12-18 ENCOUNTER — Encounter: Payer: Self-pay | Admitting: Internal Medicine

## 2018-12-18 ENCOUNTER — Ambulatory Visit (INDEPENDENT_AMBULATORY_CARE_PROVIDER_SITE_OTHER): Payer: No Typology Code available for payment source | Admitting: Internal Medicine

## 2018-12-18 ENCOUNTER — Other Ambulatory Visit: Payer: Self-pay

## 2018-12-18 VITALS — Ht 60.0 in

## 2018-12-18 DIAGNOSIS — L259 Unspecified contact dermatitis, unspecified cause: Secondary | ICD-10-CM

## 2018-12-18 MED ORDER — CLOTRIMAZOLE-BETAMETHASONE 1-0.05 % EX CREA: 1.0000 "application " | TOPICAL_CREAM | Freq: Two times a day (BID) | CUTANEOUS | 0 refills | Status: DC

## 2018-12-18 MED FILL — CLOTRIMAZOLE-BETAMETHASONE: 1-0.05 | 30 days supply | Qty: 30 | Fill #0

## 2018-12-18 NOTE — Patient Instructions (Signed)
Rash, Adult  A rash is a change in the color of your skin. A rash can also change the way your skin feels. There are many different conditions and factors that can cause a rash. Follow these instructions at home: The goal of treatment is to stop the itching and keep the rash from spreading. Watch for any changes in your symptoms. Let your doctor know about them. Follow these instructions to help with your condition: Medicine Take or apply over-the-counter and prescription medicines only as told by your doctor. These may include medicines:  To treat red or swollen skin (corticosteroid creams).  To treat itching.  To treat an allergy (oral antihistamines).  To treat very bad symptoms (oral corticosteroids).  Skin care  Put cool cloths (compresses) on the affected areas.  Do not scratch or rub your skin.  Avoid covering the rash. Make sure that the rash is exposed to air as much as possible. Managing itching and discomfort  Avoid hot showers or baths. These can make itching worse. A cold shower may help.  Try taking a bath with: ? Epsom salts. You can get these at your local pharmacy or grocery store. Follow the instructions on the package. ? Baking soda. Pour a small amount into the bath as told by your doctor. ? Colloidal oatmeal. You can get this at your local pharmacy or grocery store. Follow the instructions on the package.  Try putting baking soda paste onto your skin. Stir water into baking soda until it gets like a paste.  Try putting on a lotion that relieves itchiness (calamine lotion).  Keep cool and out of the sun. Sweating and being hot can make itching worse. General instructions   Rest as needed.  Drink enough fluid to keep your pee (urine) pale yellow.  Wear loose-fitting clothing.  Avoid scented soaps, detergents, and perfumes. Use gentle soaps, detergents, perfumes, and other cosmetic products.  Avoid anything that causes your rash. Keep a journal to  help track what causes your rash. Write down: ? What you eat. ? What cosmetic products you use. ? What you drink. ? What you wear. This includes jewelry.  Keep all follow-up visits as told by your doctor. This is important. Contact a doctor if:  You sweat at night.  You lose weight.  You pee (urinate) more than normal.  You pee less than normal, or you notice that your pee is a darker color than normal.  You feel weak.  You throw up (vomit).  Your skin or the whites of your eyes look yellow (jaundice).  Your skin: ? Tingles. ? Is numb.  Your rash: ? Does not go away after a few days. ? Gets worse.  You are: ? More thirsty than normal. ? More tired than normal.  You have: ? New symptoms. ? Pain in your belly (abdomen). ? A fever. ? Watery poop (diarrhea). Get help right away if:  You have a fever and your symptoms suddenly get worse.  You start to feel mixed up (confused).  You have a very bad headache or a stiff neck.  You have very bad joint pains or stiffness.  You have jerky movements that you cannot control (seizure).  Your rash covers all or most of your body. The rash may or may not be painful.  You have blisters that: ? Are on top of the rash. ? Grow larger. ? Grow together. ? Are painful. ? Are inside your nose or mouth.  You have a rash   that: ? Looks like purple pinprick-sized spots all over your body. ? Has a "bull's eye" or looks like a target. ? Is red and painful, causes your skin to peel, and is not from being in the sun too long. Summary  A rash is a change in the color of your skin. A rash can also change the way your skin feels.  The goal of treatment is to stop the itching and keep the rash from spreading.  Take or apply over-the-counter and prescription medicines only as told by your doctor.  Contact a doctor if you have new symptoms or symptoms that get worse.  Keep all follow-up visits as told by your doctor. This is  important. This information is not intended to replace advice given to you by your health care provider. Make sure you discuss any questions you have with your health care provider. Document Released: 01/10/2008 Document Revised: 02/25/2018 Document Reviewed: 02/25/2018 Elsevier Interactive Patient Education  2019 [place] Dermatitis Dermatitis is redness, soreness, and swelling (inflammation) of the skin. Contact dermatitis is a reaction to something that touches the skin. There are two types of contact dermatitis:  Irritant contact dermatitis. This happens when something bothers (irritates) your skin, like soap.  Allergic contact dermatitis. This is caused when you are exposed to something that you are allergic to, such as poison ivy. What are the causes?  Common causes of irritant contact dermatitis include: ? Makeup. ? Soaps. ? Detergents. ? Bleaches. ? Acids. ? Metals, such as nickel.  Common causes of allergic contact dermatitis include: ? Plants. ? Chemicals. ? Jewelry. ? Latex. ? Medicines. ? Preservatives in products, such as clothing. What increases the risk?  Having a job that exposes you to things that bother your skin.  Having asthma or eczema. What are the signs or symptoms? Symptoms may happen anywhere the irritant has touched your skin. Symptoms include:  Dry or flaky skin.  Redness.  Cracks.  Itching.  Pain or a burning feeling.  Blisters.  Blood or clear fluid draining from skin cracks. With allergic contact dermatitis, swelling may occur. This may happen in places such as the eyelids, mouth, or genitals. How is this treated?  This condition is treated by checking for the cause of the reaction and protecting your skin. Treatment may also include: ? Steroid creams, ointments, or medicines. ? Antibiotic medicines or other ointments, if you have a skin infection. ? Lotion or medicines to help with itching. ? A bandage  (dressing). Follow these instructions at home: Skin care  Moisturize your skin as needed.  Put cool cloths on your skin.  Put a baking soda paste on your skin. Stir water into baking soda until it looks like a paste.  Do not scratch your skin.  Avoid having things rub up against your skin.  Avoid the use of soaps, perfumes, and dyes. Medicines  Take or apply over-the-counter and prescription medicines only as told by your doctor.  If you were prescribed an antibiotic medicine, take or apply it as told by your doctor. Do not stop using it even if your condition starts to get better. Bathing  Take a bath with: ? Epsom salts. ? Baking soda. ? Colloidal oatmeal.  Bathe less often.  Bathe in warm water. Avoid using hot water. Bandage care  If you were given a bandage, change it as told by your health care provider.  Wash your hands with soap and water before and after you change your  bandage. If soap and water are not available, use hand sanitizer. General instructions  Avoid the things that caused your reaction. If you do not know what caused it, keep a journal. Write down: ? What you eat. ? What skin products you use. ? What you drink. ? What you wear in the area that has symptoms. This includes jewelry.  Check the affected areas every day for signs of infection. Check for: ? More redness, swelling, or pain. ? More fluid or blood. ? Warmth. ? Pus or a bad smell.  Keep all follow-up visits as told by your doctor. This is important. Contact a doctor if:  You do not get better with treatment.  Your condition gets worse.  You have signs of infection, such as: ? More swelling. ? Tenderness. ? More redness. ? Soreness. ? Warmth.  You have a fever.  You have new symptoms. Get help right away if:  You have a very bad headache.  You have neck pain.  Your neck is stiff.  You throw up (vomit).  You feel very sleepy.  You see red streaks coming from the  area.  Your bone or joint near the area hurts after the skin has healed.  The area turns darker.  You have trouble breathing. Summary  Dermatitis is redness, soreness, and swelling of the skin.  Symptoms may occur where the irritant has touched you.  Treatment may include medicines and skin care.  If you do not know what caused your reaction, keep a journal.  Contact a doctor if your condition gets worse or you have signs of infection. This information is not intended to replace advice given to you by your health care provider. Make sure you discuss any questions you have with your health care provider. Document Released: 05/21/2009 Document Revised: 02/06/2018 Document Reviewed: 02/06/2018 Elsevier Interactive Patient Education  2019 [employer].

## 2018-12-30 ENCOUNTER — Encounter: Payer: Self-pay | Admitting: Internal Medicine

## 2018-12-30 NOTE — Progress Notes (Signed)
Virtual Visit via Video   This visit type was conducted due to national recommendations for restrictions regarding the COVID-19 Pandemic (e.g. social distancing) in an effort to limit this patient's exposure and mitigate transmission in our community.  Due to her co-morbid illnesses, this patient is at least at moderate risk for complications without adequate follow up.  This format is felt to be most appropriate for this patient at this time.  All issues noted in this document were discussed and addressed.  A limited physical exam was performed with this format.    This visit type was conducted due to national recommendations for restrictions regarding the COVID-19 Pandemic (e.g. social distancing) in an effort to limit this patient's exposure and mitigate transmission in our community.  Patients identity confirmed using two different identifiers.  This format is felt to be most appropriate for this patient at this time.  All issues noted in this document were discussed and addressed.  No physical exam was performed (except for noted visual exam findings with Video Visits).    Date:  12/30/2018   ID:  [name], DOB [birth year], MRN [id number]  Patient Location:  In her car, parked.   Provider location:   Office    Chief Complaint:  Rash on finger  History of Present Illness:    [name] is a 53 y.o. female who presents via video conferencing for a telehealth visit today.    The patient does not have symptoms concerning for COVID-19 infection (fever, chills, cough, or new shortness of breath).   She presents today for virtual visit. 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 She is concerned about a rash on her finger. She first noticed this about a week ago. There has been no improvement in her sx. She has not tried any otc meds for relief. It does itch. She has not worn any new  jewelry, or used any new detergents/soaps.  Rash  This is a new problem. The current episode started 1 to 4 weeks ago. The problem is unchanged. The affected locations include the left fingers. The rash is characterized by scaling and itchiness. She was exposed to nothing. Pertinent negatives include no joint pain. Past treatments include nothing.     Past Medical History:  Diagnosis Date  . Asthma    no inhaler  . Hypertension   . PONV (postoperative nausea and vomiting) 2006   Past Surgical History:  Procedure Laterality Date  . CESAREAN SECTION  1995  . HYSTEROSCOPY N/A 12/13/2012   Procedure: HYSTEROSCOPY/IUD REMOVAL;  Surgeon: Marvene Staff, MD;  Location: [facility];  Service: Gynecology;  Laterality: N/A;  . TUBAL LIGATION  2006     Current Meds  Medication Sig  . Albuterol Sulfate (PROAIR RESPICLICK) 297 (90 Base) MCG/ACT AEPB Inhale 2 puffs into the lungs every 4 (four) hours as needed.  . cyclobenzaprine (FLEXERIL) 10 MG tablet Take 1 tablet (10 mg total) by mouth at bedtime. Please add prn  . fluticasone (FLONASE) 50 MCG/ACT nasal spray Place 1 spray into both nostrils daily.  . Magnesium 400 MG CAPS Take 400 mg by mouth Nightly.  . olmesartan (BENICAR) 20 MG tablet Take 1 tablet (20 mg total) by mouth daily.  Marland Kitchen spironolactone (ALDACTONE) 25 MG tablet Take 1 tablet (25 mg total) by mouth daily.  . Vitamin D, Cholecalciferol, 1000 units CAPS Take 1,000 mg by mouth daily.  Allergies:   Peanut-containing drug products and Shellfish allergy   Social History   Tobacco Use  . Smoking status: Former Smoker    Packs/day: 0.50    Years: 33.00    Pack years: 16.50    Types: Cigarettes    Last attempt to quit: 05/04/2012    Years since quitting: 6.6  . Smokeless tobacco: Never Used  Substance Use Topics  . Alcohol use: No  . Drug use: No     Family Hx: The patient's family history includes Alcohol abuse in her father; Hypertension in her mother.  ROS:   Please  see the history of present illness.    Review of Systems  Constitutional: Negative.   Respiratory: Negative.   Cardiovascular: Negative.   Gastrointestinal: Negative.   Musculoskeletal: Negative for joint pain.  Skin: Positive for rash.  Neurological: Negative.   Psychiatric/Behavioral: Negative.     All other systems reviewed and are negative.   Labs/Other Tests and Data Reviewed:    Recent Labs: 06/03/2018: ALT 14; BUN 13; Creatinine, Ser 0.76; Potassium 4.1; Sodium 141; TSH 0.410   Recent Lipid Panel Lab Results  Component Value Date/Time   CHOL 186 02/18/2008 08:47 PM   TRIG 117 02/18/2008 08:47 PM   HDL 36 (L) 02/18/2008 08:47 PM   CHOLHDL 5.2 Ratio 02/18/2008 08:47 PM   LDLCALC 127 (H) 02/18/2008 08:47 PM    Wt Readings from Last 3 Encounters:  11/27/18 202 lb 3.2 oz (91.7 kg)  09/30/18 206 lb 3.2 oz (93.5 kg)  06/03/18 203 lb 12.8 oz (92.4 kg)     Exam:    Vital Signs:  Ht 5' (1.524 m)   BMI 39.49 kg/m     Physical Exam  Constitutional: She is oriented to person, place, and time and well-developed, well-nourished, and in no distress.  HENT:  Head: Normocephalic and atraumatic.  Pulmonary/Chest: Effort normal.  Neurological: She is alert and oriented to person, place, and time.  Skin: Rash noted.  Scaly, sl. Hyperpigmented rash on left hand, index finger. Small, similar rash on ring finger. No vesicular lesions noted  Psychiatric: Affect normal.  Nursing note and vitals reviewed.   ASSESSMENT & PLAN:     1. Contact dermatitis and eczema  She was given rx lotrisone cream to apply to affected area twice daily as needed.  She will contact me in a week or so, if she does not notice any improvement in her symptoms.   COVID-19 Education: The signs and symptoms of COVID-19 were discussed with the patient and how to seek care for testing (follow up with PCP or arrange E-visit).  The importance of social distancing was discussed today.  Patient Risk:    After full review of this patients clinical status, I feel that they are at least moderate risk at this time.  Time:   Today, I have spent 7 minutes  with the patient with telehealth technology discussing above diagnoses.     Medication Adjustments/Labs and Tests Ordered: Current medicines are reviewed at length with the patient today.  Concerns regarding medicines are outlined above.   Tests Ordered: No orders of the defined types were placed in this encounter.   Medication Changes: Meds ordered this encounter  Medications  . clotrimazole-betamethasone (LOTRISONE) cream    Sig: Apply 1 application topically 2 (two) times daily.    Dispense:  30 g    Refill:  0    Disposition:  Follow up prn  Signed, Maximino Greenland, MD

## 2019-01-02 ENCOUNTER — Encounter: Payer: Self-pay | Admitting: Internal Medicine

## 2019-01-02 ENCOUNTER — Other Ambulatory Visit: Payer: Self-pay

## 2019-01-02 MED ORDER — OLMESARTAN MEDOXOMIL 20 MG PO TABS: 20.0000 mg | ORAL_TABLET | Freq: Every day | ORAL | 1 refills | Status: DC

## 2019-01-02 MED ORDER — SPIRONOLACTONE 25 MG PO TABS: 25.0000 mg | ORAL_TABLET | Freq: Every day | ORAL | 1 refills | Status: DC

## 2019-01-02 MED FILL — OLMESARTAN MEDOXOMIL 20 MG: 20 | 90 days supply | Qty: 90 | Fill #0

## 2019-01-02 MED FILL — SPIRONOLACTONE 25 MG TABLET: 25 | 90 days supply | Qty: 90 | Fill #0

## 2019-01-13 ENCOUNTER — Ambulatory Visit (INDEPENDENT_AMBULATORY_CARE_PROVIDER_SITE_OTHER): Payer: No Typology Code available for payment source | Admitting: Neurology

## 2019-01-13 DIAGNOSIS — G4733 Obstructive sleep apnea (adult) (pediatric): Secondary | ICD-10-CM | POA: Diagnosis not present

## 2019-01-13 DIAGNOSIS — R0683 Snoring: Secondary | ICD-10-CM

## 2019-01-13 DIAGNOSIS — J4521 Mild intermittent asthma with (acute) exacerbation: Secondary | ICD-10-CM

## 2019-01-13 DIAGNOSIS — R519 Headache, unspecified: Secondary | ICD-10-CM

## 2019-01-13 DIAGNOSIS — I10 Essential (primary) hypertension: Secondary | ICD-10-CM

## 2019-01-14 ENCOUNTER — Other Ambulatory Visit: Payer: Self-pay

## 2019-01-20 NOTE — Addendum Note (Signed)
Addended by: [name] on: 01/20/2019 04:59 PM   Modules accepted: Orders

## 2019-01-20 NOTE — Procedures (Signed)
Patient Information     First Name: Shelby Last Name: Hall ID: [id number]  Birth Date: 25-Jan-1966 Age: 53 Gender: Female  Referring Provider:      Glendale Chard, MD BMI: 35.9 (W=203 lb, H=5' 3'')     Epworth:  6  Sleep Study Information    Study Date: Jan 13, 2019  History:   [name] is a 53 y.o. female Patient and seen in a Video -Consultation upon referral from Dr. Baird Cancer.  She reports of recurrent headache problem is the most recent episode starting in mid February.  She describes the quality of the pain as aching, dull and throbbing and not that severe she rated it at 5 out of 10 , Dr. Baird Cancer referred her because the patient also stated that she was snoring and she wants to make sure that this is not a case of obstructive sleep apnea related morning headaches.  The patient is in the class II obesity severity with a BMI between 36 and 37, and she is suffering from essential hypertension. Summary & Diagnosis:            Very mild Sleep apnea (obstructive sleep apnea at AHI of 7.1/h REM AHI 22.2/h) with strong REM sleep accentuation. No clinically significant hypoxemia was seen. The patient did snore according to the RDI data. The patient did not sleep supine, but prone and on her side which may reduce the apnea count.             Recommendations:      I recommend CPAP auto-titration with a device setting from 6 through 16 cm water presssure, 3 cm EPR , heated humidity and interface of patient's choice.   Physician Name:  [name], MD  01-20-2019         Sleep Summary    Oxygen Saturation Statistics     Start Study Time: End Study Time: Total Recording Time:  9:53:16 PM 6:00:26 AM 8 h, 7 min  Total Sleep Time % REM of Sleep Time:  7 hrs, 6 min 32.6    Mean: 96 Minimum: 88 Maximum: 100  Mean of Desaturations Nadirs (%):   92  Oxygen Desatur. %: 4-9 10-20 >20 Total  Events Number Total  13 100.0  0 0.0  0 0.0  13 100.0   Oxygen Saturation: <90 <=88 <85 <80 <70  Duration (minutes): Sleep % 0.0 0.0 0.0 0.0 0.0 0.0 0.0 0.0 0.0 0.0     Respiratory Indices      Total Events REM NREM All Night  pRDI:  44  pAHI:  37 ODI:  13  pAHIc:  0  % CSR: 0.0 24.2 22.2 7.7 0.0 4.6 3.4 1.2 0.0 8.5 7.1 2.5 0.0       Pulse Rate Statistics during Sleep (BPM)      Mean: 79 Minimum: N/A Maximum: 112    Indices are calculated using technically valid sleep time of  5 hrs, 12 min. pRDI/pAHI are calculated using oxi desaturations ? 3%  Body Position Statistics  Position Supine Prone Right Left Non-Supine  Sleep (min) 0.0 353.5 4.0 69.2 426.7  Sleep % 0.0 82.9 0.9 16.2 100.0  pRDI N/A 8.6 N/A 8.2 8.5  pAHI N/A 7.4 N/A 5.9 7.1  ODI N/A 2.6 N/A 2.4 2.5     Snoring Statistics Snoring Level (dB) >40 >50 >60 >70 >80 >Threshold (45)  Sleep (min) 426.6 122.0 16.1 0.0 0.0 222.0  Sleep % 100.0 28.6 3.8 0.0 0.0 52.0  Mean: 47 dB Sleep Stages Chart

## 2019-01-21 ENCOUNTER — Encounter: Payer: Self-pay | Admitting: Neurology

## 2019-01-21 ENCOUNTER — Telehealth: Payer: Self-pay | Admitting: Neurology

## 2019-01-21 NOTE — Telephone Encounter (Signed)
Called patient to discuss sleep study results. No answer at this time. LVM for the patient to call back.  I will also send the pt a mychart message.

## 2019-01-21 NOTE — Telephone Encounter (Signed)
-----   Message from [name], MD sent at 01/20/2019  4:59 PM EDT -----   Very mild Sleep apnea (obstructive sleep apnea at AHI of 7.1/h REM AHI 22.2/h) with strong REM sleep accentuation. No clinically significant hypoxemia was seen. The patient did snore according to the RDI data. The patient did not sleep supine, but prone and on her side which may reduce the apnea count.             Recommendations:     I recommend CPAP auto-titration with a device setting from 6 through 16 cm water pressure, 3 cm EPR , heated humidity and interface of patient's choice.   Physician Name:  [name], MD  01-20-2019

## 2019-01-28 ENCOUNTER — Encounter: Payer: Self-pay | Admitting: Internal Medicine

## 2019-02-10 ENCOUNTER — Encounter: Payer: Self-pay | Admitting: Internal Medicine

## 2019-02-11 ENCOUNTER — Other Ambulatory Visit: Payer: Self-pay

## 2019-02-11 MED ORDER — MAGNESIUM 400 MG PO CAPS: 400.0000 mg | ORAL_CAPSULE | Freq: Every evening | ORAL | 1 refills | Status: DC

## 2019-02-11 MED FILL — MAGNESIUM OXIDE 400 MG TABS: 400 | 90 days supply | Qty: 90 | Fill #0

## 2019-02-27 ENCOUNTER — Other Ambulatory Visit: Payer: Self-pay | Admitting: Internal Medicine

## 2019-02-27 ENCOUNTER — Encounter: Payer: Self-pay | Admitting: Internal Medicine

## 2019-02-27 MED ORDER — CLOTRIMAZOLE-BETAMETHASONE 1-0.05 % EX CREA: 1.0000 "application " | TOPICAL_CREAM | Freq: Two times a day (BID) | CUTANEOUS | 0 refills | Status: DC

## 2019-02-27 MED FILL — CLOTRIMAZOLE-BETAMETHASONE: 1-0.05 | 15 days supply | Qty: 30 | Fill #0

## 2019-03-13 ENCOUNTER — Other Ambulatory Visit: Payer: Self-pay

## 2019-03-13 ENCOUNTER — Ambulatory Visit: Payer: No Typology Code available for payment source | Admitting: Family Medicine

## 2019-03-13 ENCOUNTER — Encounter: Payer: Self-pay | Admitting: Family Medicine

## 2019-03-13 DIAGNOSIS — Z9989 Dependence on other enabling machines and devices: Secondary | ICD-10-CM

## 2019-03-13 DIAGNOSIS — G4733 Obstructive sleep apnea (adult) (pediatric): Secondary | ICD-10-CM | POA: Diagnosis not present

## 2019-03-13 NOTE — Progress Notes (Signed)
PATIENT: [name] DOB: 20-May-1966  REASON FOR VISIT: follow up HISTORY FROM: patient  Chief Complaint  Patient presents with   Follow-up    Room 2, alone. CPAP/OSA f/u     HISTORY OF PRESENT ILLNESS: Today 03/13/19 [name] is a 53 y.o. female here today for follow up for mild OSA on CPAP.  She feels that she is doing fairly well with CPAP therapy at home.  She does endorse some anxiety in the beginning.  She reached out to her sleep care and feels that this has been beneficial.  Compliance report dated 02/10/2019 through 03/11/2019 reveals that she has used CPAP 27 out of the last 30 days for compliance of 90%.  26 days she used her CPAP machine greater than 4 hours for compliance of 87%.  AHI was 0.9 on 6 to 16 cm of water and EPR of 3.  There was no significant leak.  She reports that she feels energized and has no concerns today.   HISTORY: (copied from Dr Dohmeier's note on 12/03/2018)  Chief complaint according to patient : " my husband says I snore, my mother confirmed that", and " I have a new form of headache in the morning". 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  The environment has not changed her sleep related morning headaches.    HPI:  [name] is a 52 y.o. female Patient and seen here in a Video -Consultation upon referral from Dr. Baird Cancer.  She reports of recurrent headache problem is the most recent episode starting in mid February.  She describes the quality of the pain as aching, dull and throbbing and not that severe she rated it at 5 out of 10 , Dr. Baird Cancer encouraged her to try magnesium at night and she referred her because the patient also stated that she was snoring and she wants  to make sure that this is not a case of obstructive sleep apnea related morning headaches.  The patient is in the class II obesity severity with a BMI between 36 and 37, and she is suffering from essential hypertension. In our telephone and video interview the patient stated that her headaches are migrainous they concentrate on the forehead they appear in the morning they are present when she wakes up but they do not wake her up from sleep. She does not experience: Nausea, photophobia or phonophobia.  Sleep and medical history:  Asthma, morning headaches, migraine headaches. See above  Family medical-/ sleep history: The patient is unaware of a first-degree relative being diagnosed with sleep apnea.  She was not even sure if her parents ever be snored but her sister has complained about restless leg symptoms.  Social history: The patient is currently working from home  ( Covid )she is married and currently her son 59 is living with them she also has a Psychiatrist that has a new baby and lives in the same house.  She states she has multiple stepchildren and 18 step grandchildren.  She exercises 3 times weekly keeps a heart healthy diet with low sodium.  She drinks 1 cup of coffee in the morning she does not use sodas, she does not use alcohol and she does not use any form of  tobacco.  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  However she is going to her bedroom at 930 where she is watching movies and TV.  Once she is asleep she stops snoring.  She is not quite sure how long she actually stays awake with the TV in the background but it may be 30 minutes.  She prefers to sleep on her sides she uses only one pillow for head support and she sleeps on a flat non-adjustable bed.  She has one bathroom break each night  and she goes back to sleep usually promptly doing her normal work hours she has to rise at 6 AM and is woken by an alarm.  She usually feels rested and restored in the morning.  She does not endorse any restless legs the snoring has been more bothersome to her husband.   REVIEW OF SYSTEMS: Out of a complete 14 system review of symptoms, the patient complains only of the following symptoms, none and all other reviewed systems are negative.  Epworth sleepiness scale: 2 Fatigue severity scale: 9  ALLERGIES: Allergies  Allergen Reactions   Peanut-Containing Drug Products Itching    Walnuts, pecans (tree nuts)   Shellfish Allergy Hives and Itching    HOME MEDICATIONS: Outpatient Medications Prior to Visit  Medication Sig Dispense Refill   Albuterol Sulfate (PROAIR RESPICLICK) 546 (90 Base) MCG/ACT AEPB Inhale 2 puffs into the lungs every 4 (four) hours as needed. 1 each 5   clotrimazole-betamethasone (LOTRISONE) cream Apply 1 application topically 2 (two) times daily. 30 g 0   fluticasone (FLONASE) 50 MCG/ACT nasal spray Place 1 spray into both nostrils daily. 16 g 0   Magnesium 400 MG CAPS Take 400 mg by mouth Nightly. 90 capsule 1   olmesartan (BENICAR) 20 MG tablet Take 1 tablet (20 mg total) by mouth daily. 90 tablet 1   spironolactone (ALDACTONE) 25 MG tablet Take 1 tablet (25 mg total) by mouth daily. 90 tablet 1   Vitamin D, Cholecalciferol, 1000 units CAPS Take 1,000 mg by mouth daily.     cyclobenzaprine (FLEXERIL) 10 MG tablet Take 1 tablet (10 mg total) by mouth at bedtime. Please add prn 30 tablet 0   No facility-administered medications prior to visit.     PAST MEDICAL HISTORY: Past Medical History:  Diagnosis Date   Asthma    no inhaler   Hypertension    PONV (postoperative nausea and vomiting) 2006    PAST SURGICAL HISTORY: Past Surgical History:  Procedure Laterality Date   CESAREAN SECTION  1995   HYSTEROSCOPY N/A 12/13/2012   Procedure:  HYSTEROSCOPY/IUD REMOVAL;  Surgeon: Marvene Staff, MD;  Location: [facility];  Service: Gynecology;  Laterality: N/A;   TUBAL LIGATION  2006    FAMILY HISTORY: Family History  Problem Relation Age of Onset   Hypertension Mother    Alcohol abuse Father     SOCIAL HISTORY: Social History   Socioeconomic History   Marital status: Married    Spouse name: Not on file   Number of children: Not on file   Years of education: Not on file   Highest education level: Not on file  Occupational History   Not on file  Social Needs   Financial resource strain: Not on file   Food insecurity  Worry: Not on file    Inability: Not on file   Transportation needs    Medical: Not on file    Non-medical: Not on file  Tobacco Use   Smoking status: Former Smoker    Packs/day: 0.50    Years: 33.00    Pack years: 16.50    Types: Cigarettes    Quit date: 05/04/2012    Years since quitting: 6.8   Smokeless tobacco: Never Used  Substance and Sexual Activity   Alcohol use: No   Drug use: No   Sexual activity: Never  Lifestyle   Physical activity    Days per week: Not on file    Minutes per session: Not on file   Stress: Not on file  Relationships   Social connections    Talks on phone: Not on file    Gets together: Not on file    Attends religious service: Not on file    Active member of club or organization: Not on file    Attends meetings of clubs or organizations: Not on file    Relationship status: Not on file   Intimate partner violence    Fear of current or ex partner: Not on file    Emotionally abused: Not on file    Physically abused: Not on file    Forced sexual activity: Not on file  Other Topics Concern   Not on file  Social History Narrative   Not on file      PHYSICAL EXAM  Vitals:   03/13/19 1502  BP: 109/68  Pulse: 75  Temp: 98.7 F (37.1 C)  Weight: 209 lb (94.8 kg)  Height: 5\' 3"  (1.6 m)   Body mass index is 37.02  kg/m.  Generalized: Well developed, in no acute distress  Mallampati 2+ Neurological examination  Mentation: Alert oriented to time, place, history taking. Follows all commands speech and language fluent Cranial nerve II-XII: Pupils were equal round reactive to light. Extraocular movements were full, visual field were full on confrontational test. Facial sensation and strength were normal. Uvula tongue midline. Head turning and shoulder shrug  were normal and symmetric. Motor: The motor testing reveals 5 over 5 strength of all 4 extremities. Good symmetric motor tone is noted throughout.  Gait and station: Gait is normal.    DIAGNOSTIC DATA (LABS, IMAGING, TESTING) - I reviewed patient records, labs, notes, testing and imaging myself where available.  No flowsheet data found.   Lab Results  Component Value Date   WBC 10.2 12/02/2012   HGB 13.8 12/02/2012   HCT 37.7 12/02/2012   MCV 79.5 12/02/2012   PLT 388 12/02/2012      Component Value Date/Time   NA 141 06/03/2018 1512   K 4.1 06/03/2018 1512   CL 102 06/03/2018 1512   CO2 24 06/03/2018 1512   GLUCOSE 88 06/03/2018 1512   GLUCOSE 114 (H) 12/02/2012 1620   BUN 13 06/03/2018 1512   CREATININE 0.76 06/03/2018 1512   CALCIUM 9.7 06/03/2018 1512   PROT 6.5 06/03/2018 1512   ALBUMIN 4.0 06/03/2018 1512   AST 16 06/03/2018 1512   ALT 14 06/03/2018 1512   ALKPHOS 50 06/03/2018 1512   BILITOT <0.2 06/03/2018 1512   GFRNONAA 90 06/03/2018 1512   GFRAA 104 06/03/2018 1512   Lab Results  Component Value Date   CHOL 186 02/18/2008   HDL 36 (L) 02/18/2008   LDLCALC 127 (H) 02/18/2008   TRIG 117 02/18/2008   CHOLHDL 5.2 Ratio 02/18/2008  Lab Results  Component Value Date   HGBA1C 5.8 (H) 06/03/2018   No results found for: VITAMINB12 Lab Results  Component Value Date   TSH 0.410 (L) 06/03/2018    ASSESSMENT AND PLAN 53 y.o. year old female  has a past medical history of Asthma, Hypertension, and PONV (postoperative  nausea and vomiting) (2006). here with     ICD-10-CM   1. OSA on CPAP  G47.33 For home use only DME continuous positive airway pressure (CPAP)   Z99.89     She is doing very well on CPAP therapy at home.  She has been able to work through some anxiety and feels that she is doing okay.  Compliance data reveals optimal compliance.  She was encouraged to continue using CPAP nightly and for greater than 4 hours each night.  We will follow-up annually, sooner if needed.  She verbalizes understanding and agreement with this plan.   Orders Placed This Encounter  Procedures   For home use only DME continuous positive airway pressure (CPAP)    supplies    Order Specific Question:   Length of Need    Answer:   Lifetime    Order Specific Question:   Patient has OSA or probable OSA    Answer:   Yes    Order Specific Question:   Is the patient currently using CPAP in the home    Answer:   Yes    Order Specific Question:   Settings    Answer:   Other see comments    Order Specific Question:   CPAP supplies needed    Answer:   Mask, headgear, cushions, filters, heated tubing and water chamber     No orders of the defined types were placed in this encounter.     I spent 15 minutes with the patient. 50% of this time was spent counseling and educating patient on plan of care and medications.    Debbora Presto, FNP-C 03/13/2019, 3:39 PM [facility] [address] [place], [place] [address] [phone]

## 2019-04-01 ENCOUNTER — Encounter: Payer: Self-pay | Admitting: Internal Medicine

## 2019-04-02 ENCOUNTER — Other Ambulatory Visit: Payer: Self-pay

## 2019-04-02 MED ORDER — OLMESARTAN MEDOXOMIL 20 MG PO TABS: 20.0000 mg | ORAL_TABLET | Freq: Every day | ORAL | 1 refills | Status: DC

## 2019-04-02 MED ORDER — SPIRONOLACTONE 25 MG PO TABS: 25.0000 mg | ORAL_TABLET | Freq: Every day | ORAL | 1 refills | Status: DC

## 2019-04-02 MED FILL — OLMESARTAN MEDOXOMIL 20 MG: 20 | 90 days supply | Qty: 90 | Fill #0

## 2019-04-02 MED FILL — SPIRONOLACTONE 25 MG TABLET: 25 | 90 days supply | Qty: 90 | Fill #0

## 2019-04-08 ENCOUNTER — Encounter: Payer: Self-pay | Admitting: Internal Medicine

## 2019-04-11 ENCOUNTER — Encounter: Payer: Self-pay | Admitting: Internal Medicine

## 2019-04-22 ENCOUNTER — Encounter: Payer: Self-pay | Admitting: Internal Medicine

## 2019-04-22 ENCOUNTER — Ambulatory Visit (INDEPENDENT_AMBULATORY_CARE_PROVIDER_SITE_OTHER): Payer: No Typology Code available for payment source | Admitting: Internal Medicine

## 2019-04-22 ENCOUNTER — Other Ambulatory Visit: Payer: Self-pay

## 2019-04-22 VITALS — BP 116/70 | HR 79 | Temp 98.5°F | Ht 63.0 in | Wt 209.6 lb

## 2019-04-22 DIAGNOSIS — I10 Essential (primary) hypertension: Secondary | ICD-10-CM | POA: Diagnosis not present

## 2019-04-22 DIAGNOSIS — Z0000 Encounter for general adult medical examination without abnormal findings: Secondary | ICD-10-CM | POA: Diagnosis not present

## 2019-04-22 DIAGNOSIS — Z23 Encounter for immunization: Secondary | ICD-10-CM | POA: Diagnosis not present

## 2019-04-22 LAB — POCT URINALYSIS DIPSTICK
Bilirubin, UA: NEGATIVE
Glucose, UA: NEGATIVE
Ketones, UA: NEGATIVE
Leukocytes, UA: NEGATIVE
Nitrite, UA: NEGATIVE
Protein, UA: NEGATIVE
Spec Grav, UA: 1.02 (ref 1.010–1.025)
Urobilinogen, UA: 0.2 E.U./dL
pH, UA: 7 (ref 5.0–8.0)

## 2019-04-22 LAB — POCT UA - MICROALBUMIN
Albumin/Creatinine Ratio, Urine, POC: 30
Creatinine, POC: 200 mg/dL
Microalbumin Ur, POC: 10 mg/L

## 2019-04-22 NOTE — Progress Notes (Signed)
Subjective:     Patient ID: [name] , female    DOB: 1966-01-01 , 53 y.o.   MRN: [id number]   Chief Complaint  Patient presents with  . Annual Exam  . Hypertension    HPI  She is here today for a full physical exam. She is followed by Dr. Garwin Hall for her pap smears.   Hypertension This is a chronic problem. The current episode started more than 1 year ago. The problem has been gradually improving since onset. The problem is controlled. Pertinent negatives include no blurred vision, chest pain, palpitations or shortness of breath. Risk factors for coronary artery disease include obesity, sedentary lifestyle and post-menopausal state. Past treatments include angiotensin blockers. The current treatment provides moderate improvement. Compliance problems include exercise.      Past Medical History:  Diagnosis Date  . Asthma    no inhaler  . Hypertension   . PONV (postoperative nausea and vomiting) 2006     Family History  Problem Relation Age of Onset  . Hypertension Mother   . Alcohol abuse Father      Current Outpatient Medications:  .  Albuterol Sulfate (PROAIR RESPICLICK) 258 (90 Base) MCG/ACT AEPB, Inhale 2 puffs into the lungs every 4 (four) hours as needed., Disp: 1 each, Rfl: 5 .  clotrimazole-betamethasone (LOTRISONE) cream, Apply 1 application topically 2 (two) times daily., Disp: 30 g, Rfl: 0 .  fluticasone (FLONASE) 50 MCG/ACT nasal spray, Place 1 spray into both nostrils daily., Disp: 16 g, Rfl: 0 .  Magnesium 400 MG CAPS, Take 400 mg by mouth Nightly., Disp: 90 capsule, Rfl: 1 .  olmesartan (BENICAR) 20 MG tablet, Take 1 tablet (20 mg total) by mouth daily., Disp: 90 tablet, Rfl: 1 .  spironolactone (ALDACTONE) 25 MG tablet, Take 1 tablet (25 mg total) by mouth daily., Disp: 90 tablet, Rfl: 1 .  Vitamin D, Cholecalciferol, 1000 units CAPS, Take 1,000 mg by mouth daily., Disp: , Rfl:    Allergies  Allergen Reactions  . Peanut-Containing Drug  Products Itching    Walnuts, pecans (tree nuts)  . Shellfish Allergy Hives and Itching     Review of Systems  Constitutional: Negative.   HENT: Negative.   Eyes: Negative.  Negative for blurred vision.  Respiratory: Negative.  Negative for shortness of breath.   Cardiovascular: Negative.  Negative for chest pain and palpitations.  Endocrine: Negative.   Genitourinary: Negative.   Musculoskeletal: Negative.   Skin: Negative.   Allergic/Immunologic: Negative.   Neurological: Negative.   Hematological: Negative.   Psychiatric/Behavioral: Negative.      Today's Vitals   04/22/19 0857  BP: 116/70  Pulse: 79  Temp: 98.5 F (36.9 C)  TempSrc: Oral  SpO2: 98%  Weight: 209 lb 9.6 oz (95.1 kg)  Height: 5' 3" (1.6 m)   Body mass index is 37.13 kg/m.   Objective:  Physical Exam Vitals signs and nursing note reviewed.  Constitutional:      Appearance: Normal appearance. She is obese.  HENT:     Head: Normocephalic and atraumatic.     Right Ear: Tympanic membrane, ear canal and external ear normal.     Left Ear: Tympanic membrane, ear canal and external ear normal.     Nose: Nose normal.     Mouth/Throat:     Mouth: Mucous membranes are moist.     Pharynx: Oropharynx is clear.  Eyes:     Extraocular Movements: Extraocular movements intact.     Conjunctiva/sclera: Conjunctivae normal.  Pupils: Pupils are equal, round, and reactive to light.  Neck:     Musculoskeletal: Normal range of motion and neck supple.  Cardiovascular:     Rate and Rhythm: Normal rate and regular rhythm.     Pulses: Normal pulses.     Heart sounds: Normal heart sounds.  Pulmonary:     Effort: Pulmonary effort is normal.     Breath sounds: Normal breath sounds.  Chest:     Breasts: Tanner Score is 5.        Right: Normal. No swelling, bleeding, inverted nipple, mass, nipple discharge or skin change.        Left: Normal. No swelling, bleeding, inverted nipple, mass, nipple discharge or skin  change.  Abdominal:     General: Bowel sounds are normal.     Palpations: Abdomen is soft.     Comments: Obese, difficult to assess organomegaly  Genitourinary:    Comments: deferred Musculoskeletal: Normal range of motion.  Skin:    General: Skin is warm and dry.  Neurological:     General: No focal deficit present.     Mental Status: She is alert and oriented to person, place, and time.  Psychiatric:        Mood and Affect: Mood normal.        Behavior: Behavior normal.         Assessment And Plan:     1. Routine general medical examination at health care facility  A full exam was performed.  Importance of monthly self breast exams was discussed with the patient.  I will refer her to GYN for pelvic exam as requested.  I will also request her records from [name] for previous colonoscopy report . PATIENT HAS BEEN ADVISED TO GET 30-45 MINUTES REGULAR EXERCISE NO LESS THAN FOUR TO FIVE DAYS PER WEEK - BOTH WEIGHTBEARING EXERCISES AND AEROBIC ARE RECOMMENDED.  SHE WAS ADVISED TO FOLLOW A HEALTHY DIET WITH AT LEAST SIX FRUITS/VEGGIES PER DAY, DECREASE INTAKE OF RED MEAT, AND TO INCREASE FISH INTAKE TO TWO DAYS PER WEEK.  MEATS/FISH SHOULD NOT BE FRIED, BAKED OR BROILED IS PREFERABLE.  I SUGGEST WEARING SPF 50 SUNSCREEN ON EXPOSED PARTS AND ESPECIALLY WHEN IN THE DIRECT SUNLIGHT FOR AN EXTENDED PERIOD OF TIME.  PLEASE AVOID FAST FOOD RESTAURANTS AND INCREASE YOUR WATER INTAKE.  - POCT Urinalysis Dipstick (81002) - POCT UA - Microalbumin - CMP14+EGFR - CBC - Lipid panel - Hemoglobin A1c  2. Essential hypertension, benign  Chronic, well controlled.  She will continue with current meds. She is encouraged to avoid adding salt to her foods.  EKG performed, no new changes noted. She will rto in six months for re-evaluation.   - POCT UA - Microalbumin - EKG 12-Lead  3. Flu vaccine need  She was given flu vaccine to update her immunization history.   Shelby Greenland, MD    THE  PATIENT IS ENCOURAGED TO PRACTICE SOCIAL DISTANCING DUE TO THE COVID-19 PANDEMIC.

## 2019-04-22 NOTE — Patient Instructions (Signed)
Health Maintenance, Female Adopting a healthy lifestyle and getting preventive care are important in promoting health and wellness. Ask your health care provider about:  The right schedule for you to have regular tests and exams.  Things you can do on your own to prevent diseases and keep yourself healthy. What should I know about diet, weight, and exercise? Eat a healthy diet   Eat a diet that includes plenty of vegetables, fruits, low-fat dairy products, and lean protein.  Do not eat a lot of foods that are high in solid fats, added sugars, or sodium. Maintain a healthy weight Body mass index (BMI) is used to identify weight problems. It estimates body fat based on height and weight. Your health care provider can help determine your BMI and help you achieve or maintain a healthy weight. Get regular exercise Get regular exercise. This is one of the most important things you can do for your health. Most adults should:  Exercise for at least 150 minutes each week. The exercise should increase your heart rate and make you sweat (moderate-intensity exercise).  Do strengthening exercises at least twice a week. This is in addition to the moderate-intensity exercise.  Spend less time sitting. Even light physical activity can be beneficial. Watch cholesterol and blood lipids Have your blood tested for lipids and cholesterol at 54 years of age, then have this test every 5 years. Have your cholesterol levels checked more often if:  Your lipid or cholesterol levels are high.  You are older than 54 years of age.  You are at high risk for heart disease. What should I know about cancer screening? Depending on your health history and family history, you may need to have cancer screening at various ages. This may include screening for:  Breast cancer.  Cervical cancer.  Colorectal cancer.  Skin cancer.  Lung cancer. What should I know about heart disease, diabetes, and high blood  pressure? Blood pressure and heart disease  High blood pressure causes heart disease and increases the risk of stroke. This is more likely to develop in people who have high blood pressure readings, are of African descent, or are overweight.  Have your blood pressure checked: ? Every 3-5 years if you are 18-39 years of age. ? Every year if you are 40 years old or older. Diabetes Have regular diabetes screenings. This checks your fasting blood sugar level. Have the screening done:  Once every three years after age 40 if you are at a normal weight and have a low risk for diabetes.  More often and at a younger age if you are overweight or have a high risk for diabetes. What should I know about preventing infection? Hepatitis B If you have a higher risk for hepatitis B, you should be screened for this virus. Talk with your health care provider to find out if you are at risk for hepatitis B infection. Hepatitis C Testing is recommended for:  Everyone born from 1945 through 1965.  Anyone with known risk factors for hepatitis C. Sexually transmitted infections (STIs)  Get screened for STIs, including gonorrhea and chlamydia, if: ? You are sexually active and are younger than 54 years of age. ? You are older than 54 years of age and your health care provider tells you that you are at risk for this type of infection. ? Your sexual activity has changed since you were last screened, and you are at increased risk for chlamydia or gonorrhea. Ask your health care provider if   you are at risk.  Ask your health care provider about whether you are at high risk for HIV. Your health care provider may recommend a prescription medicine to help prevent HIV infection. If you choose to take medicine to prevent HIV, you should first get tested for HIV. You should then be tested every 3 months for as long as you are taking the medicine. Pregnancy  If you are about to stop having your period (premenopausal) and  you may become pregnant, seek counseling before you get pregnant.  Take 400 to 800 micrograms (mcg) of folic acid every day if you become pregnant.  Ask for birth control (contraception) if you want to prevent pregnancy. Osteoporosis and menopause Osteoporosis is a disease in which the bones lose minerals and strength with aging. This can result in bone fractures. If you are 65 years old or older, or if you are at risk for osteoporosis and fractures, ask your health care provider if you should:  Be screened for bone loss.  Take a calcium or vitamin D supplement to lower your risk of fractures.  Be given hormone replacement therapy (HRT) to treat symptoms of menopause. Follow these instructions at home: Lifestyle  Do not use any products that contain nicotine or tobacco, such as cigarettes, e-cigarettes, and chewing tobacco. If you need help quitting, ask your health care provider.  Do not use street drugs.  Do not share needles.  Ask your health care provider for help if you need support or information about quitting drugs. Alcohol use  Do not drink alcohol if: ? Your health care provider tells you not to drink. ? You are pregnant, may be pregnant, or are planning to become pregnant.  If you drink alcohol: ? Limit how much you use to 0-1 drink a day. ? Limit intake if you are breastfeeding.  Be aware of how much alcohol is in your drink. In the [place] one drink equals one 12 oz bottle of beer (355 mL), one 5 oz glass of wine (148 mL), or one 1 oz glass of hard liquor (44 mL). General instructions  Schedule regular health, dental, and eye exams.  Stay current with your vaccines.  Tell your health care provider if: ? You often feel depressed. ? You have ever been abused or do not feel safe at home. Summary  Adopting a healthy lifestyle and getting preventive care are important in promoting health and wellness.  Follow your health care provider's instructions about healthy  diet, exercising, and getting tested or screened for diseases.  Follow your health care provider's instructions on monitoring your cholesterol and blood pressure. This information is not intended to replace advice given to you by your health care provider. Make sure you discuss any questions you have with your health care provider. Document Released: 02/06/2011 Document Revised: 07/17/2018 Document Reviewed: 07/17/2018 Elsevier Patient Education  2020 [employer]  

## 2019-04-23 LAB — CBC
Hematocrit: 39.7 % (ref 34.0–46.6)
Hemoglobin: 13.6 g/dL (ref 11.1–15.9)
MCH: 29 pg (ref 26.6–33.0)
MCHC: 34.3 g/dL (ref 31.5–35.7)
MCV: 85 fL (ref 79–97)
Platelets: 400 10*3/uL (ref 150–450)
RBC: 4.69 x10E6/uL (ref 3.77–5.28)
RDW: 13.4 % (ref 11.7–15.4)
WBC: 7.2 10*3/uL (ref 3.4–10.8)

## 2019-04-23 LAB — CMP14+EGFR
ALT: 17 IU/L (ref 0–32)
AST: 21 IU/L (ref 0–40)
Albumin/Globulin Ratio: 1.7 (ref 1.2–2.2)
Albumin: 4.4 g/dL (ref 3.8–4.9)
Alkaline Phosphatase: 49 IU/L (ref 39–117)
BUN/Creatinine Ratio: 15 (ref 9–23)
BUN: 12 mg/dL (ref 6–24)
Bilirubin Total: 0.3 mg/dL (ref 0.0–1.2)
CO2: 23 mmol/L (ref 20–29)
Calcium: 9.9 mg/dL (ref 8.7–10.2)
Chloride: 103 mmol/L (ref 96–106)
Creatinine, Ser: 0.81 mg/dL (ref 0.57–1.00)
GFR calc Af Amer: 96 mL/min/{1.73_m2} (ref >59)
GFR calc non Af Amer: 83 mL/min/{1.73_m2} (ref >59)
Globulin, Total: 2.6 g/dL (ref 1.5–4.5)
Glucose: 94 mg/dL (ref 65–99)
Potassium: 4.6 mmol/L (ref 3.5–5.2)
Sodium: 139 mmol/L (ref 134–144)
Total Protein: 7 g/dL (ref 6.0–8.5)

## 2019-04-23 LAB — LIPID PANEL
Chol/HDL Ratio: 3.8 ratio (ref 0.0–4.4)
Cholesterol, Total: 188 mg/dL (ref 100–199)
HDL: 50 mg/dL (ref >39)
LDL Chol Calc (NIH): 124 mg/dL — ABNORMAL HIGH (ref 0–99)
Triglycerides: 74 mg/dL (ref 0–149)
VLDL Cholesterol Cal: 14 mg/dL (ref 5–40)

## 2019-04-23 LAB — HEMOGLOBIN A1C
Est. average glucose Bld gHb Est-mCnc: 120 mg/dL
Hgb A1c MFr Bld: 5.8 % — ABNORMAL HIGH (ref 4.8–5.6)

## 2019-04-29 ENCOUNTER — Encounter: Payer: Self-pay | Admitting: Internal Medicine

## 2019-04-30 ENCOUNTER — Encounter: Payer: Self-pay | Admitting: Internal Medicine

## 2019-05-19 ENCOUNTER — Telehealth: Payer: No Typology Code available for payment source | Admitting: Physician Assistant

## 2019-05-19 ENCOUNTER — Encounter: Payer: Self-pay | Admitting: Internal Medicine

## 2019-05-19 ENCOUNTER — Other Ambulatory Visit: Payer: Self-pay

## 2019-05-19 DIAGNOSIS — R05 Cough: Secondary | ICD-10-CM

## 2019-05-19 DIAGNOSIS — R0982 Postnasal drip: Secondary | ICD-10-CM

## 2019-05-19 DIAGNOSIS — J329 Chronic sinusitis, unspecified: Secondary | ICD-10-CM | POA: Diagnosis not present

## 2019-05-19 DIAGNOSIS — R059 Cough, unspecified: Secondary | ICD-10-CM

## 2019-05-19 MED ORDER — MAGNESIUM 400 MG PO CAPS: 400.0000 mg | ORAL_CAPSULE | Freq: Every evening | ORAL | 1 refills | Status: DC

## 2019-05-19 NOTE — Progress Notes (Signed)
We are sorry that you are not feeling well.  Here is how we plan to help!  Based on what you have shared with me it looks like you have sinusitis.  Sinusitis is inflammation and infection in the sinus cavities of the head.  Based on your presentation I believe you most likely have Acute Viral Sinusitis. This is an infection most likely caused by a virus.  Symptoms of AVS are typically worst days 3-6 and start to resolve around day 10.  If your symptoms continue >10 days, or significantly worsen over the next 5 days, please let us know.   There is not specific treatment for viral sinusitis other than to help you with the symptoms until the infection runs its course.  You may use an oral decongestant such as Mucinex D or if you have glaucoma or high blood pressure use plain Mucinex. Saline nasal spray help and can safely be used as often as needed for congestion, I have prescribed: Azelastine nasal spray 2 sprays in each nostril twice a day  Some authorities believe that zinc sprays or the use of Echinacea may shorten the course of your symptoms. Stay well hydrated.   Sinus infections are not as easily transmitted as other respiratory infection, however we still recommend that you avoid close contact with loved ones, especially the very young and elderly.  Remember to wash your hands thoroughly throughout the day as this is the number one way to prevent the spread of infection!  Home Care:  Only take medications as instructed by your medical team.  Do not take these medications with alcohol.  A steam or ultrasonic humidifier can help congestion.  You can place a towel over your head and breathe in the steam from hot water coming from a faucet.  Avoid close contacts especially the very young and the elderly.  Cover your mouth when you cough or sneeze.  Always remember to wash your hands.  Get Help Right Away If:  You develop worsening fever or sinus pain.  You develop a severe head ache or  visual changes.  Your symptoms persist after you have completed your treatment plan.  Make sure you  Understand these instructions.  Will watch your condition.  Will get help right away if you are not doing well or get worse.  Your e-visit answers were reviewed by a board certified advanced clinical practitioner to complete your personal care plan.  Depending on the condition, your plan could have included both over the counter or prescription medications.  If there is a problem please reply  once you have received a response from your provider.  Your safety is important to [place].  If you have drug allergies check your prescription carefully.    You can use MyChart to ask questions about today's visit, request a non-urgent call back, or ask for a work or school excuse for 24 hours related to this e-Visit. If it has been greater than 24 hours you will need to follow up with your provider, or enter a new e-Visit to address those concerns.  You will get an e-mail in the next two days asking about your experience.  I hope that your e-visit has been valuable and will speed your recovery. Thank you for using e-visits.   Greater than 5 minutes, yet less than 10 minutes of time have been spent researching, coordinating and implementing care for this patient today.

## 2019-05-20 ENCOUNTER — Encounter: Payer: Self-pay | Admitting: Internal Medicine

## 2019-05-20 MED ORDER — FLUTICASONE PROPIONATE 50 MCG/ACT NA SUSP: 1.0000 | Freq: Every day | NASAL | 0 refills | Status: DC

## 2019-05-20 MED FILL — FLUTICASONE PROP 50 MCG SPR: 50 | 30 days supply | Qty: 16 | Fill #0

## 2019-05-20 NOTE — Addendum Note (Signed)
Addended by: Chevis Pretty on: 05/20/2019 04:26 PM   Modules accepted: Orders

## 2019-05-26 ENCOUNTER — Encounter: Payer: Self-pay | Admitting: Internal Medicine

## 2019-05-28 ENCOUNTER — Encounter: Payer: Self-pay | Admitting: Internal Medicine

## 2019-06-02 ENCOUNTER — Encounter: Payer: Self-pay | Admitting: Internal Medicine

## 2019-06-04 ENCOUNTER — Other Ambulatory Visit: Payer: Self-pay

## 2019-06-04 ENCOUNTER — Encounter: Payer: Self-pay | Admitting: Advanced Practice Midwife

## 2019-06-04 ENCOUNTER — Ambulatory Visit (INDEPENDENT_AMBULATORY_CARE_PROVIDER_SITE_OTHER): Payer: No Typology Code available for payment source | Admitting: Advanced Practice Midwife

## 2019-06-04 VITALS — BP 110/75 | HR 72 | Ht 63.0 in | Wt 210.0 lb

## 2019-06-04 DIAGNOSIS — Z1151 Encounter for screening for human papillomavirus (HPV): Secondary | ICD-10-CM

## 2019-06-04 DIAGNOSIS — Z01419 Encounter for gynecological examination (general) (routine) without abnormal findings: Secondary | ICD-10-CM | POA: Diagnosis not present

## 2019-06-04 DIAGNOSIS — Z124 Encounter for screening for malignant neoplasm of cervix: Secondary | ICD-10-CM

## 2019-06-04 NOTE — Progress Notes (Signed)
Last pap 2017-wnl  Gets occasional sharp pains on right pelvic area Had mammo this year -WNL

## 2019-06-04 NOTE — Progress Notes (Signed)
GYNECOLOGY ANNUAL PREVENTATIVE CARE ENCOUNTER NOTE  History:     [name] is a 53 y.o. G1P0 female here for a routine annual gynecologic exam.  Current complaints:None. Denies abnormal vaginal bleeding, discharge, problems with intercourse or other gynecologic concerns.    Patient endorses history of occasional right sided pelvic pain which she has had "for years". She endorses multiple workups including pelvic ultrasounds with normal findings. Currently resolved without intervention.  Patient states that due to her insurance her PCP performs all health maintenence oversight except for her pap smear.  Non-smoker. Lives with her husband. Very happy with sex life. Last menstrual cycle about six years ago.   Gynecologic History Patient's last menstrual period was 03/17/2013. Contraception: post menopausal status Last Pap: 10/2010. Results were: normal with negative HPV Last mammogram: 11/2018. Results were: biopsy advised, also conducted 11/2018. Advised mammogram one year following  Obstetric History OB History  Gravida Para Term Preterm AB Living  1         1  SAB TAB Ectopic Multiple Live Births          1    # Outcome Date GA Lbr Len/2nd Weight Sex Delivery Anes PTL Lv  1 Gravida             Past Medical History:  Diagnosis Date  . Asthma    no inhaler  . Hypertension   . PONV (postoperative nausea and vomiting) 2006    Past Surgical History:  Procedure Laterality Date  . CESAREAN SECTION  1995  . HYSTEROSCOPY N/A 12/13/2012   Procedure: HYSTEROSCOPY/IUD REMOVAL;  Surgeon: Marvene Staff, MD;  Location: [facility];  Service: Gynecology;  Laterality: N/A;  . TUBAL LIGATION  2006    Current Outpatient Medications on File Prior to Visit  Medication Sig Dispense Refill  . Albuterol Sulfate (PROAIR RESPICLICK) [id number] (90 Base) MCG/ACT AEPB Inhale 2 puffs into the lungs every 4 (four) hours as needed. 1 each 5  . clotrimazole-betamethasone (LOTRISONE)  cream Apply 1 application topically 2 (two) times daily. 30 g 0  . fluticasone (FLONASE) 50 MCG/ACT nasal spray Place 1 spray into both nostrils daily. 16 g 0  . Magnesium 400 MG CAPS Take 400 mg by mouth Nightly. 90 capsule 1  . olmesartan (BENICAR) 20 MG tablet Take 1 tablet (20 mg total) by mouth daily. 90 tablet 1  . spironolactone (ALDACTONE) 25 MG tablet Take 1 tablet (25 mg total) by mouth daily. 90 tablet 1  . Vitamin D, Cholecalciferol, 1000 units CAPS Take 1,000 mg by mouth daily.     No current facility-administered medications on file prior to visit.     Allergies  Allergen Reactions  . Peanut-Containing Drug Products Itching    Walnuts, pecans (tree nuts)  . Shellfish Allergy Hives and Itching    Social History:  reports that she quit smoking about 7 years ago. Her smoking use included cigarettes. She has a 16.50 pack-year smoking history. She has never used smokeless tobacco. She reports that she does not drink alcohol or use drugs.  Family History  Problem Relation Age of Onset  . Hypertension Mother   . Alcohol abuse Father     The following portions of the patient's history were reviewed and updated as appropriate: allergies, current medications, past family history, past medical history, past social history, past surgical history and problem list.  Review of Systems Pertinent items noted in HPI and remainder of comprehensive ROS otherwise negative.  Physical Exam:  BP 110/75   Pulse 72   Ht 5\' 3"  (1.6 m)   Wt 210 lb (95.3 kg)   LMP 03/17/2013   BMI 37.20 kg/m  CONSTITUTIONAL: Well-developed, well-nourished female in no acute distress.  HENT:  Normocephalic, atraumatic, External right and left ear normal. Oropharynx is clear and moist EYES: Conjunctivae and EOM are normal. Pupils are equal, round, and reactive to light. No scleral icterus.  NECK: Normal range of motion, supple, no masses.  Normal thyroid.  SKIN: Skin is warm and dry. No rash noted. Not  diaphoretic. No erythema. No pallor. MUSCULOSKELETAL: Normal range of motion. No tenderness.  No cyanosis, clubbing, or edema.  2+ distal pulses. NEUROLOGIC: Alert and oriented to person, place, and time. Normal reflexes, muscle tone coordination. No cranial nerve deficit noted. PSYCHIATRIC: Normal mood and affect. Normal behavior. Normal judgment and thought content. CARDIOVASCULAR: Normal heart rate noted, regular rhythm RESPIRATORY: Clear to auscultation bilaterally. Effort and breath sounds normal, no problems with respiration noted. BREASTS: Symmetric in size. No masses, skin changes, nipple drainage, or lymphadenopathy. ABDOMEN: Soft, normal bowel sounds, no distention noted.  No tenderness, rebound or guarding.  PELVIC: Normal appearing external genitalia; normal appearing vaginal mucosa and cervix.  No abnormal discharge noted.  Pap smear obtained.  Normal uterine size, no other palpable masses, no uterine or adnexal tenderness.   Assessment and Plan:    1. Well woman exam with routine gynecological exam - No concerning findings - Reviewed recommendations by CDC and ACOG for health maintenance for gorgeous, fabulous women age 17+ - Cytology - PAP  Will follow up results of pap smear and manage accordingly. Routine preventative health maintenance measures emphasized. Please refer to After Visit Summary for other counseling recommendations.      Total visit time 30 minutes. Greater than 50% of visit spent in counseling and coordination of care.  Mallie Snooks, MSN, CNM Certified Nurse Midwife, [facility] for [employer], [place] 06/04/19 7:33 PM

## 2019-06-10 ENCOUNTER — Encounter: Payer: No Typology Code available for payment source | Admitting: Internal Medicine

## 2019-06-11 LAB — CYTOLOGY - PAP
Comment: NEGATIVE
Diagnosis: NEGATIVE
High risk HPV: NEGATIVE

## 2019-07-02 ENCOUNTER — Encounter: Payer: Self-pay | Admitting: Internal Medicine

## 2019-07-02 ENCOUNTER — Other Ambulatory Visit: Payer: Self-pay

## 2019-07-02 MED ORDER — OLMESARTAN MEDOXOMIL 20 MG PO TABS: 20.0000 mg | ORAL_TABLET | Freq: Every day | ORAL | 1 refills | Status: DC

## 2019-07-02 MED ORDER — SPIRONOLACTONE 25 MG PO TABS: 25.0000 mg | ORAL_TABLET | Freq: Every day | ORAL | 1 refills | Status: DC

## 2019-07-02 MED FILL — SPIRONOLACTONE 25 MG TABS: 25 | 90 days supply | Qty: 90 | Fill #0

## 2019-07-02 MED FILL — OLMESARTAN MEDOXOMIL 20 MG: 20 | 90 days supply | Qty: 90 | Fill #0

## 2019-08-04 ENCOUNTER — Encounter: Payer: Self-pay | Admitting: Internal Medicine

## 2019-08-06 ENCOUNTER — Encounter: Payer: Self-pay | Admitting: Internal Medicine

## 2019-08-11 ENCOUNTER — Ambulatory Visit: Payer: No Typology Code available for payment source | Admitting: Nurse Practitioner

## 2019-08-14 ENCOUNTER — Other Ambulatory Visit: Payer: Self-pay

## 2019-08-14 ENCOUNTER — Encounter: Payer: Self-pay | Admitting: Nurse Practitioner

## 2019-08-14 ENCOUNTER — Ambulatory Visit (INDEPENDENT_AMBULATORY_CARE_PROVIDER_SITE_OTHER): Payer: PRIVATE HEALTH INSURANCE | Admitting: Nurse Practitioner

## 2019-08-14 VITALS — BP 100/70 | HR 84 | Temp 98.5°F | Ht 62.6 in | Wt 214.2 lb

## 2019-08-14 DIAGNOSIS — R0789 Other chest pain: Secondary | ICD-10-CM

## 2019-08-14 DIAGNOSIS — Z6836 Body mass index (BMI) 36.0-36.9, adult: Secondary | ICD-10-CM

## 2019-08-14 DIAGNOSIS — R519 Headache, unspecified: Secondary | ICD-10-CM

## 2019-08-14 MED ORDER — PREDNISONE 10 MG (21) PO TBPK: ORAL_TABLET | ORAL | 0 refills | Status: DC

## 2019-08-14 NOTE — Progress Notes (Signed)
This visit occurred during the SARS-CoV-2 public health emergency.  Safety protocols were in place, including screening questions prior to the visit, additional usage of staff PPE, and extensive cleaning of exam room while observing appropriate contact time as indicated for disinfecting solutions.  Subjective:     Patient ID: [name] , female    DOB: 01/04/1966 , 54 y.o.   MRN: [id number]   Chief Complaint  Patient presents with  . Headache    patient has been having constant headaches she stated her headaches start after getting off work.    HPI  She also has headaches in the morning she uses a CPAP every other night - since August, she has been for a follow up.   After she takes her mask off she will have a headache.  She does admit to gaining 10 lbs since before Covid.    Headache  This is a new problem. The current episode started 1 to 4 weeks ago. The problem occurs constantly. The problem has been gradually worsening. The pain is located in the bilateral, frontal and temporal region. The pain does not radiate. The pain quality is not similar to prior headaches. The quality of the pain is described as stabbing. The pain is at a severity of 9/10 (Now headache is about an 8). Pertinent negatives include no abdominal pain, blurred vision, coughing, dizziness, hearing loss, nausea, phonophobia, photophobia, rhinorrhea, sinus pressure, sore throat, tinnitus or vomiting. Exacerbated by: when she takes her face mask off. She has tried NSAIDs and acetaminophen (occasionally) for the symptoms. Her past medical history is significant for obesity. There is no history of cancer or sinus disease.  Chest Pain  This is a new problem. The onset quality is gradual. Pain location: left side of chest  The pain does not radiate. Associated symptoms include headaches and shortness of breath. Pertinent negatives include no abdominal pain, cough, dizziness, nausea, palpitations, syncope or  vomiting. The pain is aggravated by nothing. Risk factors include lack of exercise and obesity.  Pertinent negatives for past medical history include no CAD and no cancer.     Past Medical History:  Diagnosis Date  . Asthma    no inhaler  . Hypertension   . PONV (postoperative nausea and vomiting) 2006     Family History  Problem Relation Age of Onset  . Hypertension Mother   . Alcohol abuse Father      Current Outpatient Medications:  .  Albuterol Sulfate (PROAIR RESPICLICK) 734 (90 Base) MCG/ACT AEPB, Inhale 2 puffs into the lungs every 4 (four) hours as needed., Disp: 1 each, Rfl: 5 .  clotrimazole-betamethasone (LOTRISONE) cream, Apply 1 application topically 2 (two) times daily., Disp: 30 g, Rfl: 0 .  Magnesium 400 MG CAPS, Take 400 mg by mouth Nightly., Disp: 90 capsule, Rfl: 1 .  olmesartan (BENICAR) 20 MG tablet, Take 1 tablet (20 mg total) by mouth daily., Disp: 90 tablet, Rfl: 1 .  spironolactone (ALDACTONE) 25 MG tablet, Take 1 tablet (25 mg total) by mouth daily., Disp: 90 tablet, Rfl: 1 .  Vitamin D, Cholecalciferol, 1000 units CAPS, Take 1,000 mg by mouth daily., Disp: , Rfl:  .  fluticasone (FLONASE) 50 MCG/ACT nasal spray, Place 1 spray into both nostrils daily. (Patient not taking: Reported on 08/14/2019), Disp: 16 g, Rfl: 0   Allergies  Allergen Reactions  . Peanut-Containing Drug Products Itching    Walnuts, pecans (tree nuts)  . Shellfish Allergy Hives and Itching  Review of Systems  Constitutional: Negative.   HENT: Negative for hearing loss, rhinorrhea, sinus pressure, sore throat and tinnitus.   Eyes: Negative for blurred vision and photophobia.  Respiratory: Positive for shortness of breath. Negative for cough and stridor.   Cardiovascular: Positive for chest pain. Negative for palpitations and syncope.  Gastrointestinal: Negative for abdominal pain, constipation, nausea and vomiting.  Neurological: Positive for headaches. Negative for dizziness.   Psychiatric/Behavioral: Negative.      Today's Vitals   08/14/19 1624  BP: 100/70  Pulse: 84  Temp: 98.5 F (36.9 C)  TempSrc: Oral  Weight: 214 lb 3.2 oz (97.2 kg)  Height: 5' 2.6" (1.59 m)  PainSc: 8   PainLoc: Head   Body mass index is 38.43 kg/m.   Objective:  Physical Exam Constitutional:      Appearance: She is well-developed.  Cardiovascular:     Heart sounds: Normal heart sounds.  Pulmonary:     Effort: Pulmonary effort is normal. No respiratory distress.     Breath sounds: Normal breath sounds.  Abdominal:     General: Bowel sounds are normal.     Palpations: Abdomen is soft.  Musculoskeletal:        General: Normal range of motion.  Skin:    General: Skin is warm and dry.  Neurological:     Mental Status: She is alert.  Psychiatric:        Mood and Affect: Mood normal.        Behavior: Behavior normal.         Assessment And Plan:     1. Acute nonintractable headache, unspecified headache type  Will treat with kenalog   Will also check ESR   If no relief will consider a CT scan  No abnormal findings with temporal area  2. Chest discomfort  EKG done, normal  3. Class 2 severe obesity due to excess calories with serious comorbidity and body mass index (BMI) of 36.0 to 36.9 in adult [facility])  Chronic  Encouraged to increase her physical activity and eat a healthy diet.    Minette Brine, FNP    THE PATIENT IS ENCOURAGED TO PRACTICE SOCIAL DISTANCING DUE TO THE COVID-19 PANDEMIC.

## 2019-08-15 LAB — SEDIMENTATION RATE: Sed Rate: 62 mm/h — ABNORMAL HIGH (ref 0–40)

## 2019-08-15 MED FILL — predniSONE 10 MG (21) TBPK: 10 | 6 days supply | Qty: 21 | Fill #0

## 2019-08-18 ENCOUNTER — Other Ambulatory Visit: Payer: Self-pay

## 2019-09-17 ENCOUNTER — Telehealth: Payer: Self-pay | Admitting: Family Medicine

## 2019-09-17 NOTE — Telephone Encounter (Signed)
LMVM for pt that returned call. Her DME company has order from last 03-2019.  Do they need new order? Let us know.

## 2019-09-17 NOTE — Telephone Encounter (Signed)
I spoke to pt and she states that she had not had supplies since she got machine.  She is wanting to get supplies.  [facility] sent her to [place]?  She would prefer to go to [facility].  [phone]. Last seen 03-2019.

## 2019-09-17 NOTE — Telephone Encounter (Signed)
Pt has called to inform she has not had new CPAP supplies since August and she is in need.  Pt unsure as to what is going on with [facility], pt is asking for a call from RN

## 2019-09-18 NOTE — Telephone Encounter (Signed)
I called [place] at the [employer] compliance line [phone] spoke to [place].  She stated that pt has outstanding balance, once met then should be able to get supplies.  [facility] is family co of [facility].  I tried to call and LMV for pt but VM full.

## 2019-10-01 MED FILL — OLMESARTAN MEDOXOMIL 20 MG: 20 | 30 days supply | Qty: 30 | Fill #1

## 2019-10-01 MED FILL — SPIRONOLACTONE 25 MG TABS: 25 | 30 days supply | Qty: 30 | Fill #1

## 2019-10-13 ENCOUNTER — Encounter: Payer: Self-pay | Admitting: Internal Medicine

## 2019-10-23 ENCOUNTER — Ambulatory Visit: Payer: No Typology Code available for payment source | Admitting: Internal Medicine

## 2019-11-04 MED FILL — OLMESARTAN MEDOXOMIL 20 MG: 20 | 30 days supply | Qty: 30 | Fill #2

## 2019-11-04 MED FILL — SPIRONOLACTONE 25 MG TABS: 25 | 30 days supply | Qty: 30 | Fill #2

## 2019-11-17 ENCOUNTER — Ambulatory Visit: Payer: PRIVATE HEALTH INSURANCE | Admitting: Internal Medicine

## 2019-11-17 ENCOUNTER — Other Ambulatory Visit: Payer: Self-pay

## 2019-11-17 ENCOUNTER — Encounter: Payer: Self-pay | Admitting: Internal Medicine

## 2019-11-17 ENCOUNTER — Ambulatory Visit (INDEPENDENT_AMBULATORY_CARE_PROVIDER_SITE_OTHER): Payer: PRIVATE HEALTH INSURANCE | Admitting: Internal Medicine

## 2019-11-17 VITALS — BP 128/84 | HR 82 | Temp 98.1°F | Ht 62.6 in | Wt 218.6 lb

## 2019-11-17 DIAGNOSIS — I10 Essential (primary) hypertension: Secondary | ICD-10-CM | POA: Diagnosis not present

## 2019-11-17 DIAGNOSIS — R7309 Other abnormal glucose: Secondary | ICD-10-CM | POA: Diagnosis not present

## 2019-11-17 DIAGNOSIS — E6609 Other obesity due to excess calories: Secondary | ICD-10-CM

## 2019-11-17 DIAGNOSIS — Z6839 Body mass index (BMI) 39.0-39.9, adult: Secondary | ICD-10-CM

## 2019-11-17 MED ORDER — OLMESARTAN MEDOXOMIL 20 MG PO TABS: 20.0000 mg | ORAL_TABLET | Freq: Every day | ORAL | 1 refills | Status: DC

## 2019-11-17 MED ORDER — SPIRONOLACTONE 25 MG PO TABS: 25.0000 mg | ORAL_TABLET | Freq: Every day | ORAL | 1 refills | Status: DC

## 2019-11-17 NOTE — Patient Instructions (Signed)

## 2019-11-17 NOTE — Progress Notes (Signed)
This visit occurred during the SARS-CoV-2 public health emergency.  Safety protocols were in place, including screening questions prior to the visit, additional usage of staff PPE, and extensive cleaning of exam room while observing appropriate contact time as indicated for disinfecting solutions.  Subjective:     Patient ID: [name] , female    DOB: [birth year] , 54 y.o.   MRN: [id number]   Chief Complaint  Patient presents with  . Hypertension    HPI  Hypertension This is a chronic problem. The current episode started more than 1 year ago. The problem has been gradually improving since onset. The problem is controlled. Pertinent negatives include no blurred vision, chest pain, palpitations or shortness of breath. Risk factors for coronary artery disease include obesity and sedentary lifestyle. The current treatment provides moderate improvement. Compliance problems include exercise.      Past Medical History:  Diagnosis Date  . Asthma    no inhaler  . Hypertension   . PONV (postoperative nausea and vomiting) 2006     Family History  Problem Relation Age of Onset  . Hypertension Mother   . Alcohol abuse Father      Current Outpatient Medications:  .  Albuterol Sulfate (PROAIR RESPICLICK) 712 (90 Base) MCG/ACT AEPB, Inhale 2 puffs into the lungs every 4 (four) hours as needed., Disp: 1 each, Rfl: 5 .  clotrimazole-betamethasone (LOTRISONE) cream, Apply 1 application topically 2 (two) times daily., Disp: 30 g, Rfl: 0 .  fluticasone (FLONASE) 50 MCG/ACT nasal spray, Place 1 spray into both nostrils daily., Disp: 16 g, Rfl: 0 .  Magnesium 400 MG CAPS, Take 400 mg by mouth Nightly., Disp: 90 capsule, Rfl: 1 .  olmesartan (BENICAR) 20 MG tablet, Take 1 tablet (20 mg total) by mouth daily., Disp: 90 tablet, Rfl: 1 .  predniSONE (STERAPRED UNI-PAK 21 TAB) 10 MG (21) TBPK tablet, Take as directed, Disp: 21 tablet, Rfl: 0 .  spironolactone (ALDACTONE) 25 MG tablet,  Take 1 tablet (25 mg total) by mouth daily., Disp: 90 tablet, Rfl: 1 .  Vitamin D, Cholecalciferol, 1000 units CAPS, Take 1,000 mg by mouth daily., Disp: , Rfl:    Allergies  Allergen Reactions  . Peanut-Containing Drug Products Itching    Walnuts, pecans (tree nuts)  . Shellfish Allergy Hives and Itching     Review of Systems  Constitutional: Negative.   Eyes: Negative for blurred vision.  Respiratory: Negative.  Negative for shortness of breath.   Cardiovascular: Negative.  Negative for chest pain and palpitations.  Gastrointestinal: Negative.   Neurological: Negative.   Psychiatric/Behavioral: Negative.      Today's Vitals   11/17/19 1449  BP: 128/84  Pulse: 82  Temp: 98.1 F (36.7 C)  Weight: 218 lb 9.6 oz (99.2 kg)  Height: 5' 2.6" (1.59 m)   Body mass index is 39.22 kg/m.   Objective:  Physical Exam Vitals and nursing note reviewed.  Constitutional:      Appearance: Normal appearance. She is obese.  HENT:     Head: Normocephalic and atraumatic.  Cardiovascular:     Rate and Rhythm: Normal rate and regular rhythm.     Heart sounds: Normal heart sounds.  Pulmonary:     Effort: Pulmonary effort is normal.     Breath sounds: Normal breath sounds.  Skin:    General: Skin is warm.  Neurological:     General: No focal deficit present.     Mental Status: She is alert.  Psychiatric:  Mood and Affect: Mood normal.        Behavior: Behavior normal.         Assessment And Plan:     1. Essential hypertension, benign  Chronic, well controlled. She will continue with current meds. She is encouraged to avoid adding salt to her foods. I will check renal function.   - BMP8+EGFR  2. Other abnormal glucose  HER A1C HAS BEEN ELEVATED IN THE PAST. I WILL CHECK AN A1C, BMET TODAY. SHE WAS ENCOURAGED TO AVOID SUGARY BEVERAGES AND PROCESSED FOODS INCLUDNG BREADS, RICE AND PASTA.i  There are two or three pharmacologic agents that may be appropriate to treat this  condition. Will make further recommendations once her labs are available for review.   - Hemoglobin A1c - Insulin, random(561)  3. Class 2 obesity due to excess calories without serious comorbidity with body mass index (BMI) of 39.0 to 39.9 in adult  She is encouraged to strive for BMI less than 35 to decrease cardiac risk. Advised to exercise at least 30 minutes five days per week, avoid sugary beverages and limit intake of packaged, processed foods.   Maximino Greenland, MD    THE PATIENT IS ENCOURAGED TO PRACTICE SOCIAL DISTANCING DUE TO THE COVID-19 PANDEMIC.

## 2019-11-18 LAB — HEMOGLOBIN A1C
Est. average glucose Bld gHb Est-mCnc: 128 mg/dL
Hgb A1c MFr Bld: 6.1 % — ABNORMAL HIGH (ref 4.8–5.6)

## 2019-11-18 LAB — BMP8+EGFR
BUN/Creatinine Ratio: 22 (ref 9–23)
BUN: 15 mg/dL (ref 6–24)
CO2: 25 mmol/L (ref 20–29)
Calcium: 10.3 mg/dL — ABNORMAL HIGH (ref 8.7–10.2)
Chloride: 101 mmol/L (ref 96–106)
Creatinine, Ser: 0.69 mg/dL (ref 0.57–1.00)
GFR calc Af Amer: 114 mL/min/{1.73_m2} (ref >59)
GFR calc non Af Amer: 99 mL/min/{1.73_m2} (ref >59)
Glucose: 101 mg/dL — ABNORMAL HIGH (ref 65–99)
Potassium: 4.7 mmol/L (ref 3.5–5.2)
Sodium: 139 mmol/L (ref 134–144)

## 2019-11-18 LAB — INSULIN, RANDOM: INSULIN: 55.6 u[IU]/mL — ABNORMAL HIGH (ref 2.6–24.9)

## 2019-11-25 ENCOUNTER — Encounter: Payer: Self-pay | Admitting: Internal Medicine

## 2019-11-26 ENCOUNTER — Encounter: Payer: Self-pay | Admitting: Internal Medicine

## 2019-11-28 ENCOUNTER — Encounter: Payer: Self-pay | Admitting: Internal Medicine

## 2019-12-01 ENCOUNTER — Other Ambulatory Visit: Payer: Self-pay | Admitting: Internal Medicine

## 2019-12-01 DIAGNOSIS — Z1231 Encounter for screening mammogram for malignant neoplasm of breast: Secondary | ICD-10-CM

## 2019-12-02 MED FILL — OLMESARTAN MEDOXOMIL 20 MG: 20 | 30 days supply | Qty: 30 | Fill #3

## 2019-12-02 MED FILL — SPIRONOLACTONE 25 MG TABS: 25 | 30 days supply | Qty: 30 | Fill #3

## 2019-12-04 ENCOUNTER — Telehealth: Payer: Self-pay

## 2019-12-04 NOTE — Telephone Encounter (Signed)
Left vm for pt to return call. Need to know if she is ready to start Rybelsus for weight loss

## 2020-01-01 ENCOUNTER — Ambulatory Visit
Admission: RE | Admit: 2020-01-01 | Discharge: 2020-01-01 | Disposition: A | Discharge status: Home / Self Care | Payer: PRIVATE HEALTH INSURANCE | Source: Ambulatory Visit | Attending: Internal Medicine | Admitting: Internal Medicine

## 2020-01-01 ENCOUNTER — Other Ambulatory Visit: Payer: Self-pay

## 2020-01-01 DIAGNOSIS — Z1231 Encounter for screening mammogram for malignant neoplasm of breast: Secondary | ICD-10-CM

## 2020-01-01 MED FILL — NITROFURANTOIN MONO-MCR 100: 100 | 7 days supply | Qty: 14 | Fill #0

## 2020-01-02 ENCOUNTER — Ambulatory Visit: Payer: No Typology Code available for payment source

## 2020-01-02 ENCOUNTER — Other Ambulatory Visit: Payer: Self-pay | Admitting: Internal Medicine

## 2020-01-02 DIAGNOSIS — R928 Other abnormal and inconclusive findings on diagnostic imaging of breast: Secondary | ICD-10-CM

## 2020-01-05 ENCOUNTER — Encounter: Payer: Self-pay | Admitting: Internal Medicine

## 2020-01-06 ENCOUNTER — Other Ambulatory Visit: Payer: Self-pay

## 2020-01-06 ENCOUNTER — Ambulatory Visit: Payer: No Typology Code available for payment source

## 2020-01-06 ENCOUNTER — Other Ambulatory Visit: Payer: Self-pay | Admitting: Internal Medicine

## 2020-01-06 MED ORDER — SPIRONOLACTONE 25 MG PO TABS: 25.0000 mg | ORAL_TABLET | Freq: Every day | ORAL | 1 refills | Status: DC

## 2020-01-06 MED ORDER — OLMESARTAN MEDOXOMIL 20 MG PO TABS: 20.0000 mg | ORAL_TABLET | Freq: Every day | ORAL | 1 refills | Status: DC

## 2020-01-06 MED FILL — OLMESARTAN MEDOXOMIL 20 MG: 20 | 30 days supply | Qty: 30 | Fill #0

## 2020-01-06 MED FILL — SPIRONOLACTONE 25 MG TABS: 25 | 30 days supply | Qty: 30 | Fill #0

## 2020-02-05 ENCOUNTER — Encounter: Payer: Self-pay | Admitting: Internal Medicine

## 2020-02-06 MED FILL — OLMESARTAN MEDOXOMIL 20 MG: 20 | 30 days supply | Qty: 30 | Fill #1

## 2020-02-06 MED FILL — SPIRONOLACTONE 25 MG TABS: 25 | 30 days supply | Qty: 30 | Fill #1

## 2020-02-19 ENCOUNTER — Encounter: Payer: Self-pay | Admitting: Internal Medicine

## 2020-02-19 ENCOUNTER — Other Ambulatory Visit: Payer: Self-pay

## 2020-03-03 ENCOUNTER — Encounter: Payer: Self-pay | Admitting: Internal Medicine

## 2020-03-03 ENCOUNTER — Telehealth: Payer: Self-pay

## 2020-03-03 NOTE — Telephone Encounter (Signed)
Called pt to reschedule physical. Informed pt that we didn't have any appts for the days she wants. Pt cancelled physical and scheduled blood pressure check to ensure she could get refills.

## 2020-03-08 ENCOUNTER — Other Ambulatory Visit: Payer: Self-pay | Admitting: Internal Medicine

## 2020-03-08 ENCOUNTER — Ambulatory Visit
Admission: RE | Admit: 2020-03-08 | Discharge: 2020-03-08 | Disposition: A | Discharge status: Home / Self Care | Payer: Self-pay | Source: Ambulatory Visit | Attending: Internal Medicine | Admitting: Internal Medicine

## 2020-03-08 ENCOUNTER — Other Ambulatory Visit: Payer: Self-pay

## 2020-03-08 DIAGNOSIS — R599 Enlarged lymph nodes, unspecified: Secondary | ICD-10-CM

## 2020-03-08 DIAGNOSIS — R928 Other abnormal and inconclusive findings on diagnostic imaging of breast: Secondary | ICD-10-CM

## 2020-03-09 MED FILL — SPIRONOLACTONE 25 MG TABS: 25 | 30 days supply | Qty: 30 | Fill #2

## 2020-03-09 MED FILL — OLMESARTAN MEDOXOMIL 20 MG: 20 | 90 days supply | Qty: 90 | Fill #2

## 2020-03-15 ENCOUNTER — Ambulatory Visit: Payer: No Typology Code available for payment source | Admitting: Family Medicine

## 2020-04-08 MED FILL — SPIRONOLACTONE 25 MG TABS: 25 | 30 days supply | Qty: 30 | Fill #3

## 2020-04-09 ENCOUNTER — Ambulatory Visit
Admission: RE | Admit: 2020-04-09 | Discharge: 2020-04-09 | Disposition: A | Discharge status: Home / Self Care | Payer: BC Managed Care – PPO | Source: Ambulatory Visit | Attending: Internal Medicine | Admitting: Internal Medicine

## 2020-04-09 ENCOUNTER — Other Ambulatory Visit: Payer: Self-pay

## 2020-04-09 DIAGNOSIS — R599 Enlarged lymph nodes, unspecified: Secondary | ICD-10-CM

## 2020-04-09 DIAGNOSIS — R590 Localized enlarged lymph nodes: Secondary | ICD-10-CM | POA: Diagnosis not present

## 2020-04-20 DIAGNOSIS — H0102B Squamous blepharitis left eye, upper and lower eyelids: Secondary | ICD-10-CM | POA: Diagnosis not present

## 2020-04-20 DIAGNOSIS — H0102A Squamous blepharitis right eye, upper and lower eyelids: Secondary | ICD-10-CM | POA: Diagnosis not present

## 2020-04-20 DIAGNOSIS — H04123 Dry eye syndrome of bilateral lacrimal glands: Secondary | ICD-10-CM | POA: Diagnosis not present

## 2020-04-20 DIAGNOSIS — H2513 Age-related nuclear cataract, bilateral: Secondary | ICD-10-CM | POA: Diagnosis not present

## 2020-04-27 ENCOUNTER — Ambulatory Visit: Payer: PRIVATE HEALTH INSURANCE | Admitting: Family Medicine

## 2020-04-28 ENCOUNTER — Encounter: Payer: No Typology Code available for payment source | Admitting: Internal Medicine

## 2020-05-03 ENCOUNTER — Other Ambulatory Visit: Payer: Self-pay

## 2020-05-03 ENCOUNTER — Encounter: Payer: Self-pay | Admitting: Family Medicine

## 2020-05-03 ENCOUNTER — Ambulatory Visit: Payer: BC Managed Care – PPO | Admitting: Family Medicine

## 2020-05-03 VITALS — BP 120/70 | HR 76 | Ht 64.0 in | Wt 225.0 lb

## 2020-05-03 DIAGNOSIS — I10 Essential (primary) hypertension: Secondary | ICD-10-CM | POA: Diagnosis not present

## 2020-05-03 DIAGNOSIS — R200 Anesthesia of skin: Secondary | ICD-10-CM | POA: Insufficient documentation

## 2020-05-03 DIAGNOSIS — R7989 Other specified abnormal findings of blood chemistry: Secondary | ICD-10-CM | POA: Insufficient documentation

## 2020-05-03 DIAGNOSIS — G4733 Obstructive sleep apnea (adult) (pediatric): Secondary | ICD-10-CM

## 2020-05-03 DIAGNOSIS — R7303 Prediabetes: Secondary | ICD-10-CM | POA: Diagnosis not present

## 2020-05-03 DIAGNOSIS — J4520 Mild intermittent asthma, uncomplicated: Secondary | ICD-10-CM

## 2020-05-03 DIAGNOSIS — R202 Paresthesia of skin: Secondary | ICD-10-CM | POA: Diagnosis not present

## 2020-05-03 DIAGNOSIS — R252 Cramp and spasm: Secondary | ICD-10-CM

## 2020-05-03 DIAGNOSIS — E559 Vitamin D deficiency, unspecified: Secondary | ICD-10-CM | POA: Insufficient documentation

## 2020-05-03 DIAGNOSIS — E669 Obesity, unspecified: Secondary | ICD-10-CM | POA: Insufficient documentation

## 2020-05-03 DIAGNOSIS — Z9989 Dependence on other enabling machines and devices: Secondary | ICD-10-CM

## 2020-05-03 MED ORDER — PROAIR RESPICLICK 108 (90 BASE) MCG/ACT IN AEPB: 2.0000 | INHALATION_SPRAY | RESPIRATORY_TRACT | 1 refills | Status: DC | PRN

## 2020-05-03 MED FILL — ALBUTEROL SULFATE HFA 108 (: 108 (90 BAS | 18 days supply | Qty: 9 | Fill #0

## 2020-05-03 NOTE — Progress Notes (Signed)
Subjective:    Patient ID: [name], female    DOB: [birth year], 54 y.o.   MRN: [id number]  HPI Chief Complaint  Patient presents with  . new pt    new pt get established. some numbness in hands and legs for years and back pain for years. started on b12 x 2 weeks and numbness has gotten some better, hip pain for 2 years.    She is new to the practice and here to establish care.  Previous medical care: Dr. Glendale Chard at [facility]   Other providers: Neurologist- [facility] for [facility] [place]- Dr. Katy Fitch  [facility]   HTN- takes omesartan and spironolactone. Has been on this regimen for a while and started by Dr. Baird Cancer.  Amlodipine caused swelling in the past.   OSA- states it is mild and she does not use CPAP. States it causes her anxiety. She is seeing her neurologist for this.   Stopped smoking a few years ago.   States weight is a concern. States she lost 40 lbs before Covid pandemic.  States she 20 lbs back. States she knows how to lose it and just needs to do it.   Vitamin D def- takes 1,000 IUs daily   Takes vitamin B12 - started 2 weeks ago  Numbness and tingling in her fingers for months. States it may be helping   Calcium supplement every other day. Gets plenty of calcium in her diet.   Asthma- triggers are season changes. Does not have an albuterol inhaler at home.  No recent flares.    Social history: Lives with her husband, 6 children (1 biological),  18 grandchildren, works at [employer]  Diet: states sweets are her weakness  Excerise: weight loss challenge at work   Health maintenance:  Mammogram: had biopsy- benign  Colonoscopy: UTD Last Gynecological Exam: October 2020  Last Menstrual cycle: 2014   Reviewed allergies, medications, past medical, surgical, family, and social history.    Review of Systems Pertinent positives and  negatives in the history of present illness.     Objective:   Physical Exam BP 120/70   Pulse 76   Ht 5\' 4"  (1.626 m)   Wt 225 lb (102.1 kg)   LMP 03/17/2013   BMI 38.62 kg/m   Alert and in no distress. Cardiac exam shows a regular  rhythm without murmurs or gallops. Lungs are clear to auscultation. Extremities without edema. Skin is warm and dry.       Assessment & Plan:  Essential hypertension, benign - Plan: CBC with Differential/Platelet, Comprehensive metabolic panel -She is a pleasant 54 year old female who is new to the practice and establishing care today.  States blood pressure is well controlled on current medication regimen.  She takes olmesartan and spironolactone.  Amlodipine in the past has caused edema.  Mild intermittent asthma without complication - Plan: Albuterol Sulfate (PROAIR RESPICLICK) 831 (90 Base) MCG/ACT AEPB -Very well controlled.  Mainly triggered by season changes.  I will prescribe her an albuterol inhaler to have at home.  Her insurance preferred the respiclick.  OSA on CPAP -She has been followed by neurology.  States she has mild sleep apnea and has tried CPAP machine but it makes her anxious.  She will follow-up with neuro in October.  Prediabetes - Plan: Comprehensive metabolic panel, Hemoglobin A1c -Recommend weight loss and exercise to help lower blood sugars.  We will check her hemoglobin A1c and follow-up.  Low TSH level - Plan: TSH, T4, free, T3 -Check thyroid panel  Muscle cramps - Plan: CBC with Differential/Platelet, Comprehensive metabolic panel, TSH, T4, free, T3, Magnesium -Muscle cramps in her back.  These have been relieved in the past by back exercises.  I will check labs in follow-up.  I also provided her with a list of back exercises she can try in her after visit summary.  Numbness and tingling in both hands - Plan: TSH, T4, free, T3, Vitamin B12 -This has been ongoing and she started on vitamin B12 approximately 2 weeks ago.   She thinks it may be helping.  I will check her vitamin B-12 level and follow-up.  Vitamin D deficiency - Plan: VITAMIN D 25 Hydroxy (Vit-D Deficiency, Fractures) -She is currently taking 1000 IUs of vitamin D daily.  Check vitamin D level and adjust dose as appropriate  Obesity (BMI 30-39.9) -She is motivated to lose weight.  She has lost 40 pounds in the past and gained half of that back during the pandemic.  She knows that healthy diet and exercise is the best way to do this.

## 2020-05-03 NOTE — Patient Instructions (Signed)

## 2020-05-04 LAB — T3: T3, Total: 108 ng/dL (ref 71–180)

## 2020-05-04 LAB — VITAMIN B12: Vitamin B-12: 1144 pg/mL (ref 232–1245)

## 2020-05-04 LAB — COMPREHENSIVE METABOLIC PANEL
ALT: 16 IU/L (ref 0–32)
AST: 16 IU/L (ref 0–40)
Albumin/Globulin Ratio: 1.5 (ref 1.2–2.2)
Albumin: 4.3 g/dL (ref 3.8–4.9)
Alkaline Phosphatase: 54 IU/L (ref 44–121)
BUN/Creatinine Ratio: 15 (ref 9–23)
BUN: 12 mg/dL (ref 6–24)
Bilirubin Total: 0.4 mg/dL (ref 0.0–1.2)
CO2: 22 mmol/L (ref 20–29)
Calcium: 9.6 mg/dL (ref 8.7–10.2)
Chloride: 99 mmol/L (ref 96–106)
Creatinine, Ser: 0.8 mg/dL (ref 0.57–1.00)
GFR calc Af Amer: 97 mL/min/{1.73_m2} (ref >59)
GFR calc non Af Amer: 84 mL/min/{1.73_m2} (ref >59)
Globulin, Total: 2.9 g/dL (ref 1.5–4.5)
Glucose: 79 mg/dL (ref 65–99)
Potassium: 4.4 mmol/L (ref 3.5–5.2)
Sodium: 137 mmol/L (ref 134–144)
Total Protein: 7.2 g/dL (ref 6.0–8.5)

## 2020-05-04 LAB — HEMOGLOBIN A1C
Est. average glucose Bld gHb Est-mCnc: 131 mg/dL
Hgb A1c MFr Bld: 6.2 % — ABNORMAL HIGH (ref 4.8–5.6)

## 2020-05-04 LAB — CBC WITH DIFFERENTIAL/PLATELET
Basophils Absolute: 0.1 10*3/uL (ref 0.0–0.2)
Basos: 1 %
EOS (ABSOLUTE): 0.3 10*3/uL (ref 0.0–0.4)
Eos: 3 %
Hematocrit: 38.2 % (ref 34.0–46.6)
Hemoglobin: 13.2 g/dL (ref 11.1–15.9)
Immature Grans (Abs): 0 10*3/uL (ref 0.0–0.1)
Immature Granulocytes: 0 %
Lymphocytes Absolute: 3.7 10*3/uL — ABNORMAL HIGH (ref 0.7–3.1)
Lymphs: 44 %
MCH: 27.9 pg (ref 26.6–33.0)
MCHC: 34.6 g/dL (ref 31.5–35.7)
MCV: 81 fL (ref 79–97)
Monocytes Absolute: 0.6 10*3/uL (ref 0.1–0.9)
Monocytes: 7 %
Neutrophils Absolute: 3.8 10*3/uL (ref 1.4–7.0)
Neutrophils: 45 %
Platelets: 383 10*3/uL (ref 150–450)
RBC: 4.73 x10E6/uL (ref 3.77–5.28)
RDW: 13 % (ref 11.7–15.4)
WBC: 8.4 10*3/uL (ref 3.4–10.8)

## 2020-05-04 LAB — MAGNESIUM: Magnesium: 1.7 mg/dL (ref 1.6–2.3)

## 2020-05-04 LAB — VITAMIN D 25 HYDROXY (VIT D DEFICIENCY, FRACTURES): Vit D, 25-Hydroxy: 41.7 ng/mL (ref 30.0–100.0)

## 2020-05-04 LAB — T4, FREE: Free T4: 1.38 ng/dL (ref 0.82–1.77)

## 2020-05-04 LAB — TSH: TSH: 0.327 u[IU]/mL — ABNORMAL LOW (ref 0.450–4.500)

## 2020-05-05 MED FILL — SPIRONOLACTONE 25 MG TABS: 25 | 30 days supply | Qty: 30 | Fill #4

## 2020-05-06 ENCOUNTER — Encounter: Payer: Self-pay | Admitting: Family Medicine

## 2020-05-12 ENCOUNTER — Encounter: Payer: Self-pay | Admitting: Family Medicine

## 2020-05-13 ENCOUNTER — Encounter: Payer: Self-pay | Admitting: Family Medicine

## 2020-05-13 ENCOUNTER — Ambulatory Visit: Payer: BC Managed Care – PPO | Admitting: Family Medicine

## 2020-05-13 VITALS — BP 104/60 | HR 78 | Ht 63.0 in | Wt 222.0 lb

## 2020-05-13 DIAGNOSIS — G4733 Obstructive sleep apnea (adult) (pediatric): Secondary | ICD-10-CM

## 2020-05-13 DIAGNOSIS — Z9989 Dependence on other enabling machines and devices: Secondary | ICD-10-CM

## 2020-05-13 NOTE — Progress Notes (Signed)
PATIENT: [name] DOB: [birth year]  REASON FOR VISIT: follow up HISTORY FROM: patient  Chief Complaint  Patient presents with  . Follow-up    rm 6  . Sleep Apnea     HISTORY OF PRESENT ILLNESS: Today 05/13/20 [name] is a 54 y.o. female here today for follow up for OSA. She was last seen 03/2019 and doing fairly well on CPAP. She reports that she stopped using therapy. She feels that she is not resting when using CPAP. She gets very anxious and feels claustrophobic when using therapy. She denies significant anxiety outside of using CPAP. She has reached out to DME for support but just does not feel she can use CPAP. She is open to considering dental device. Not interested in [place]. She is working on healthy lifestyle habits. She is not having headaches.   HISTORY: (copied from my note on 03/13/2019)  [name] is a 54 y.o. female here today for follow up for mild OSA on CPAP.  She feels that she is doing fairly well with CPAP therapy at home.  She does endorse some anxiety in the beginning.  She reached out to her sleep care and feels that this has been beneficial.  Compliance report dated 02/10/2019 through 03/11/2019 reveals that she has used CPAP 27 out of the last 30 days for compliance of 90%.  26 days she used her CPAP machine greater than 4 hours for compliance of 87%.  AHI was 0.9 on 6 to 16 cm of water and EPR of 3.  There was no significant leak.  She reports that she feels energized and has no concerns today.   HISTORY: (copied from Dr Dohmeier's note on 12/03/2018)  Chief complaint according to patient :" my husband says I snore, my mother confirmed that", and " I have a new form of headache in the morning". The patient added that she has made the observation that her headaches are present when she works at a building with a [employer] office, this environment seems to trigger migrainous headaches and while she is now working  from home she is much less affected by migraines. The environment has not changed her sleep related morning headaches.    BHA:LPFXTKW Shelby Stimpsonis a 54 y.o.femalePatient andseen here in a Video -Consultation uponreferral from Dr.Sanders.She reports of recurrent headache problem is the most recent episode starting in mid February. She describes the quality of the pain as aching, dull and throbbing and not that severe she rated it at 5 out of 10 ,Dr. Derald Macleod her to try magnesium at night and she referred her because the patient also stated that she was snoring and she wants to make sure that this is not a case of obstructive sleep apnea related morning headaches. The patient is in the class II obesity severity with a BMI between 36 and 37, and she is suffering from essential hypertension. In our telephone and video interview the patient stated that her headaches are migrainous they concentrate on the forehead they appear in the morning they are present when she wakes up but they do not wake her up from sleep.She does not experience:Nausea, photophobia or phonophobia.  Sleepandmedical history:Asthma, morning headaches, migraine headaches. See above  Familymedical-/sleep history:The patient is unaware of a first-degree relative being diagnosed with sleep apnea. She was not even sure if her parents ever be snored but her sister has complained about restless leg symptoms.  Social history:The patient is currently working from home( Covid )  she is married and currently her son 28 is living with them she also has a Psychiatrist that has a new baby and lives in the same house. She states she has multiple stepchildren and 18 step grandchildren. She exercises 3 times weekly keeps a heart healthy diet with low sodium. She drinks 1 cup of coffee in the morning she does not use sodas, she does not use alcohol and she does not use any form of tobacco.  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 However she is going to her bedroom at 930 where she is watching movies and TV. Once she is asleep she stops snoring. She is not quite sure how long she actually stays awake with the TV in the background but it may be 30 minutes. She prefers to sleep on her sides she uses only one pillow for head support and she sleeps on a flat non-adjustable bed. She has one bathroom break each night and she goes back to sleep usually promptly doing her normal work hours she has to rise at 6 AM and is woken by an alarm. She usually feels rested and restored in the morning. She does not endorse any restless legs the snoring has been more bothersome to her husband.    REVIEW OF SYSTEMS: Out of a complete 14 system review of symptoms, the patient complains only of the following symptoms, none and all other reviewed systems are negative.  ALLERGIES: Allergies  Allergen Reactions  . Peanut-Containing Drug Products Itching    Walnuts, pecans (tree nuts)  . Shellfish Allergy Hives and Itching    HOME MEDICATIONS: Outpatient Medications Prior to Visit  Medication Sig Dispense Refill  . Albuterol Sulfate (PROAIR RESPICLICK) 979 (90 Base) MCG/ACT AEPB Inhale 2 puffs into the lungs every 4 (four) hours as needed. 1 each 1  . olmesartan (BENICAR) 20 MG tablet Take 1 tablet (20 mg total) by mouth daily. 90 tablet 1  . spironolactone (ALDACTONE) 25 MG tablet Take 1 tablet (25 mg total) by mouth daily. 90 tablet 1  . Vitamin D, Cholecalciferol, 1000 units CAPS Take 1,000 mg by mouth daily.    . clotrimazole-betamethasone (LOTRISONE) cream Apply 1 application topically 2 (two) times daily. Rash for fingers    . Cyanocobalamin (VITAMIN B-12 PO) Take by mouth.     No facility-administered  medications prior to visit.    PAST MEDICAL HISTORY: Past Medical History:  Diagnosis Date  . Asthma    no inhaler  . Hypertension   . PONV (postoperative nausea and vomiting) 2006    PAST SURGICAL HISTORY: Past Surgical History:  Procedure Laterality Date  . BREAST BIOPSY Right 11/2018   benign  . CESAREAN SECTION  1995  . HYSTEROSCOPY N/A 12/13/2012   Procedure: HYSTEROSCOPY/IUD REMOVAL;  Surgeon: Marvene Staff, MD;  Location: [facility];  Service: Gynecology;  Laterality: N/A;  . TUBAL LIGATION  2006    FAMILY HISTORY: Family History  Problem Relation Age of Onset  . Hypertension Mother   . Alcohol abuse Father     SOCIAL HISTORY: Social History   Socioeconomic History  . Marital status: Married    Spouse name: Not on file  . Number of children: Not on file  . Years of education: Not on file  . Highest education level: Not  on file  Occupational History  . Not on file  Tobacco Use  . Smoking status: Former Smoker    Packs/day: 0.50    Years: 33.00    Pack years: 16.50    Types: Cigarettes    Quit date: 05/04/2012    Years since quitting: 8.0  . Smokeless tobacco: Never Used  Vaping Use  . Vaping Use: Never used  Substance and Sexual Activity  . Alcohol use: No  . Drug use: No  . Sexual activity: Yes    Birth control/protection: None  Other Topics Concern  . Not on file  Social History Narrative  . Not on file   Social Determinants of Health   Financial Resource Strain:   . Difficulty of Paying Living Expenses: Not on file  Food Insecurity:   . Worried About Charity fundraiser in the Last Year: Not on file  . Ran Out of Food in the Last Year: Not on file  Transportation Needs:   . Lack of Transportation (Medical): Not on file  . Lack of Transportation (Non-Medical): Not on file  Physical Activity:   . Days of Exercise per Week: Not on file  . Minutes of Exercise per Session: Not on file  Stress:   . Feeling of Stress : Not on file  Social  Connections:   . Frequency of Communication with Friends and Family: Not on file  . Frequency of Social Gatherings with Friends and Family: Not on file  . Attends Religious Services: Not on file  . Active Member of Clubs or Organizations: Not on file  . Attends Archivist Meetings: Not on file  . Marital Status: Not on file  Intimate Partner Violence:   . Fear of Current or Ex-Partner: Not on file  . Emotionally Abused: Not on file  . Physically Abused: Not on file  . Sexually Abused: Not on file      PHYSICAL EXAM  Vitals:   05/13/20 0837  BP: 104/60  Pulse: 78  Weight: 222 lb (100.7 kg)  Height: 5\' 3"  (1.6 m)   Body mass index is 39.33 kg/m.  Generalized: Well developed, in no acute distress  Cardiology: normal rate and rhythm, no murmur noted Respiratory: clear to auscultation bilaterally  Neurological examination  Mentation: Alert oriented to time, place, history taking. Follows all commands speech and language fluent Cranial nerve II-XII: Pupils were equal round reactive to light. Extraocular movements were full, visual field were full  Motor: The motor testing reveals 5 over 5 strength of all 4 extremities. Good symmetric motor tone is noted throughout.   Gait and station: Gait is normal.    DIAGNOSTIC DATA (LABS, IMAGING, TESTING) - I reviewed patient records, labs, notes, testing and imaging myself where available.  No flowsheet data found.   Lab Results  Component Value Date   WBC 8.4 05/03/2020   HGB 13.2 05/03/2020   HCT 38.2 05/03/2020   MCV 81 05/03/2020   PLT 383 05/03/2020      Component Value Date/Time   NA 137 05/03/2020 1437   K 4.4 05/03/2020 1437   CL 99 05/03/2020 1437   CO2 22 05/03/2020 1437   GLUCOSE 79 05/03/2020 1437   GLUCOSE 114 (H) 12/02/2012 1620   BUN 12 05/03/2020 1437   CREATININE 0.80 05/03/2020 1437   CALCIUM 9.6 05/03/2020 1437   PROT 7.2 05/03/2020 1437   ALBUMIN 4.3 05/03/2020 1437   AST 16 05/03/2020 1437    ALT 16 05/03/2020 1437  ALKPHOS 54 05/03/2020 1437   BILITOT 0.4 05/03/2020 1437   GFRNONAA 84 05/03/2020 1437   GFRAA 97 05/03/2020 1437   Lab Results  Component Value Date   CHOL 188 04/22/2019   HDL 50 04/22/2019   LDLCALC 124 (H) 04/22/2019   TRIG 74 04/22/2019   CHOLHDL 3.8 04/22/2019   Lab Results  Component Value Date   HGBA1C 6.2 (H) 05/03/2020   Lab Results  Component Value Date   VITAMINB12 1,144 05/03/2020   Lab Results  Component Value Date   TSH 0.327 (L) 05/03/2020       ASSESSMENT AND PLAN 54 y.o. year old female  has a past medical history of Asthma, Hypertension, and PONV (postoperative nausea and vomiting) (2006). here with     ICD-10-CM   1. OSA on CPAP  G47.33    Z99.89      Issa reports significant anxiety and feelings of claustrophobia with CPAP usage. She has not used CPAP in several months. We have reviewed her HST. I have educated her on risks of untreated sleep apnea and possible alternative treatment options. I do not feel she would qualify for [facility] due to BMI. She does not seem interested even with weight loss. She is open to discussing usage of oral appliance with her dentist. She will call this week. I have also offered to try a low dose of Buspar at night to see if this would help with anxiety around CPAP therapy. She is not interested at this time but will consider. She was encouraged to continue healthy lifestyle habits. She will follow up with me pending decision to continue CPAP. She verbalizes understanding and agreement with this plan.   No orders of the defined types were placed in this encounter.    No orders of the defined types were placed in this encounter.     I spent 25 minutes with the patient. 50% of this time was spent counseling and educating patient on plan of care and medications.    Debbora Presto, FNP-C 05/13/2020, 9:22 AM [facility] [address], [place], [place] [address] [phone]

## 2020-05-13 NOTE — Patient Instructions (Signed)
Getting used to CPAP and staying with the treatment long term does take time and patience and discipline. Untreated obstructive sleep apnea when it is moderate to severe can have an adverse impact on cardiovascular health and raise her risk for heart disease, arrhythmias, hypertension, congestive heart failure, stroke and diabetes. Untreated obstructive sleep apnea causes sleep disruption, nonrestorative sleep, and sleep deprivation. This can have an impact on your day to day functioning and cause daytime sleepiness and impairment of cognitive function, memory loss, mood disturbance, and problems focussing. Using CPAP regularly can improve these symptoms.  I would have you consider 1 of 2 options.   1) Consider speaking with your dentist regarding an oral appliance for management of sleep apnea. You have mild sleep apnea that is worse when in REM stage of sleep. Fortunately, your oxygen levels appear to remain normal.   2) Consider trial of Buspar at bedtime. We could combine this with CPAP usage to see if you are more comfortable.   Follow up as needed    Sleep Apnea Sleep apnea affects breathing during sleep. It causes breathing to stop for a short time or to become shallow. It can also increase the risk of:  Heart attack.  Stroke.  Being very overweight (obese).  Diabetes.  Heart failure.  Irregular heartbeat. The goal of treatment is to help you breathe normally again. What are the causes? There are three kinds of sleep apnea:  Obstructive sleep apnea. This is caused by a blocked or collapsed airway.  Central sleep apnea. This happens when the brain does not send the right signals to the muscles that control breathing.  Mixed sleep apnea. This is a combination of obstructive and central sleep apnea. The most common cause of this condition is a collapsed or blocked airway. This can happen if:  Your throat muscles are too relaxed.  Your tongue and tonsils are too  large.  You are overweight.  Your airway is too small. What increases the risk?  Being overweight.  Smoking.  Having a small airway.  Being older.  Being female.  Drinking alcohol.  Taking medicines to calm yourself (sedatives or tranquilizers).  Having family members with the condition. What are the signs or symptoms?  Trouble staying asleep.  Being sleepy or tired during the day.  Getting angry a lot.  Loud snoring.  Headaches in the morning.  Not being able to focus your mind (concentrate).  Forgetting things.  Less interest in sex.  Mood swings.  Personality changes.  Feelings of sadness (depression).  Waking up a lot during the night to pee (urinate).  Dry mouth.  Sore throat. How is this diagnosed?  Your medical history.  A physical exam.  A test that is done when you are sleeping (sleep study). The test is most often done in a sleep lab but may also be done at home. How is this treated?   Sleeping on your side.  Using a medicine to get rid of mucus in your nose (decongestant).  Avoiding the use of alcohol, medicines to help you relax, or certain pain medicines (narcotics).  Losing weight, if needed.  Changing your diet.  Not smoking.  Using a machine to open your airway while you sleep, such as: ? An oral appliance. This is a mouthpiece that shifts your lower jaw forward. ? A CPAP device. This device blows air through a mask when you breathe out (exhale). ? An EPAP device. This has valves that you put in each  nostril. ? A BPAP device. This device blows air through a mask when you breathe in (inhale) and breathe out.  Having surgery if other treatments do not work. It is important to get treatment for sleep apnea. Without treatment, it can lead to:  High blood pressure.  Coronary artery disease.  In men, not being able to have an erection (impotence).  Reduced thinking ability. Follow these instructions at  home: Lifestyle  Make changes that your doctor recommends.  Eat a healthy diet.  Lose weight if needed.  Avoid alcohol, medicines to help you relax, and some pain medicines.  Do not use any products that contain nicotine or tobacco, such as cigarettes, e-cigarettes, and chewing tobacco. If you need help quitting, ask your doctor. General instructions  Take over-the-counter and prescription medicines only as told by your doctor.  If you were given a machine to use while you sleep, use it only as told by your doctor.  If you are having surgery, make sure to tell your doctor you have sleep apnea. You may need to bring your device with you.  Keep all follow-up visits as told by your doctor. This is important. Contact a doctor if:  The machine that you were given to use during sleep bothers you or does not seem to be working.  You do not get better.  You get worse. Get help right away if:  Your chest hurts.  You have trouble breathing in enough air.  You have an uncomfortable feeling in your back, arms, or stomach.  You have trouble talking.  One side of your body feels weak.  A part of your face is hanging down. These symptoms may be an emergency. Do not wait to see if the symptoms will go away. Get medical help right away. Call your local emergency services (911 in the [place] Do not drive yourself to the hospital. Summary  This condition affects breathing during sleep.  The most common cause is a collapsed or blocked airway.  The goal of treatment is to help you breathe normally while you sleep. This information is not intended to replace advice given to you by your health care provider. Make sure you discuss any questions you have with your health care provider. Document Revised: 05/10/2018 Document Reviewed: 03/19/2018 Elsevier Patient Education  [place].

## 2020-05-17 ENCOUNTER — Encounter: Payer: PRIVATE HEALTH INSURANCE | Admitting: Internal Medicine

## 2020-05-19 ENCOUNTER — Encounter: Payer: Self-pay | Admitting: Family Medicine

## 2020-05-20 ENCOUNTER — Telehealth (INDEPENDENT_AMBULATORY_CARE_PROVIDER_SITE_OTHER): Payer: BC Managed Care – PPO | Admitting: Family Medicine

## 2020-05-20 ENCOUNTER — Other Ambulatory Visit: Payer: BC Managed Care – PPO

## 2020-05-20 ENCOUNTER — Other Ambulatory Visit (HOSPITAL_COMMUNITY): Payer: Self-pay | Admitting: Family Medicine

## 2020-05-20 ENCOUNTER — Encounter: Payer: Self-pay | Admitting: Family Medicine

## 2020-05-20 ENCOUNTER — Other Ambulatory Visit: Payer: Self-pay

## 2020-05-20 DIAGNOSIS — J4531 Mild persistent asthma with (acute) exacerbation: Secondary | ICD-10-CM | POA: Diagnosis not present

## 2020-05-20 DIAGNOSIS — R0982 Postnasal drip: Secondary | ICD-10-CM | POA: Diagnosis not present

## 2020-05-20 DIAGNOSIS — R059 Cough, unspecified: Secondary | ICD-10-CM

## 2020-05-20 LAB — POC COVID19 BINAXNOW: SARS Coronavirus 2 Ag: NEGATIVE

## 2020-05-20 MED ORDER — PREDNISONE 10 MG (21) PO TBPK: ORAL_TABLET | Freq: Every day | ORAL | 0 refills | Status: DC

## 2020-05-20 MED FILL — predniSONE 10 MG TABS: 10 | 6 days supply | Qty: 21 | Fill #0

## 2020-05-20 NOTE — Progress Notes (Signed)
MyChart Video Visit    Virtual Visit via Video Note   This visit type was conducted due to national recommendations for restrictions regarding the COVID-19 Pandemic (e.g. social distancing) in an effort to limit this patient's exposure and mitigate transmission in our community. This patient is at least at moderate risk for complications without adequate follow up. This format is felt to be most appropriate for this patient at this time. Physical exam was limited by quality of the video and audio technology used for the visit.   Patient location: work  Provider location: Office  I discussed the limitations of evaluation and management by telemedicine and the availability of in person appointments. The patient expressed understanding and agreed to proceed.  Patient: [name]   DOB: 01/19/1966   54 y.o. Female  MRN: [id number] Visit Date: 05/20/2020  Today's healthcare provider: Harland Dingwall, NP-C   Chief Complaint  Patient presents with  . URI   Subjective    URI  This is a new problem. The current episode started in the past 7 days. The problem has been unchanged. There has been no fever. Associated symptoms include congestion, coughing, ear pain, headaches, rhinorrhea, a sore throat and wheezing. Pertinent negatives include no chest pain, diarrhea, nausea, sinus pain, sneezing or vomiting. She has tried inhaler use and increased fluids for the symptoms. The treatment provided mild relief.    Headache with cough. Wheezing and she has been using her albuterol inhaler 3 times daily for the past week.   Dry cough mainly and with chest congestion.   Using Advil cold and sinus.   She has not been tested for Covid  She is fully vaccinated.   No recent antibiotics or steroids.     Medications: Outpatient Medications Prior to Visit  Medication Sig  . Albuterol Sulfate (PROAIR RESPICLICK) 732 (90 Base) MCG/ACT AEPB Inhale 2 puffs into the lungs every 4 (four)  hours as needed.  Marland Kitchen olmesartan (BENICAR) 20 MG tablet Take 1 tablet (20 mg total) by mouth daily.  Marland Kitchen spironolactone (ALDACTONE) 25 MG tablet Take 1 tablet (25 mg total) by mouth daily.  . Vitamin D, Cholecalciferol, 1000 units CAPS Take 1,000 mg by mouth daily.   No facility-administered medications prior to visit.    Review of Systems  Constitutional: Negative for appetite change, chills, fatigue and fever.  HENT: Positive for congestion, ear pain, postnasal drip, rhinorrhea and sore throat. Negative for sinus pressure, sinus pain and sneezing.   Respiratory: Positive for cough, chest tightness and wheezing.   Cardiovascular: Negative for chest pain.  Gastrointestinal: Negative for diarrhea, nausea and vomiting.  Neurological: Positive for headaches. Negative for weakness.      Objective    LMP 03/17/2013    Physical Exam  Alert and oriented and in no acute distress.  Respirations unlabored.  Speaking in complete sentence without difficulty.  She does have consistent throat clearing and sounds congested including a congested cough.    Assessment & Plan    Mild persistent asthma with acute exacerbation - Plan: predniSONE (STERAPRED UNI-PAK 21 TAB) 10 MG (21) TBPK tablet  Cough - Plan: POC COVID-19  Post-nasal drainage - Plan: POC COVID-19  She will come to our office for Covid testing to rule this out.  I suspect she is having an acute asthma exacerbation most likely from a viral illness.  Discussed taking Mucinex DM, a nondrowsy antihistamine and hydrating.  I will also cover her with a course of steroids. I  will follow-up pending her Covid test result.   I discussed the assessment and treatment plan with the patient. The patient was provided an opportunity to ask questions and all were answered. The patient agreed with the plan and demonstrated an understanding of the instructions.   The patient was advised to call back or seek an in-person evaluation if the symptoms worsen  or if the condition fails to improve as anticipated.  I provided 15 minutes of non-face-to-face time during this encounter.   Harland Dingwall, NP-C [place] [phone] (phone) [phone] (fax)  [place]

## 2020-05-21 LAB — NOVEL CORONAVIRUS, NAA: SARS-CoV-2, NAA: NOT DETECTED

## 2020-05-21 LAB — SARS-COV-2, NAA 2 DAY TAT

## 2020-05-24 ENCOUNTER — Encounter: Payer: PRIVATE HEALTH INSURANCE | Admitting: Internal Medicine

## 2020-05-26 ENCOUNTER — Encounter: Payer: Self-pay | Admitting: Family Medicine

## 2020-06-06 IMAGING — MG DIGITAL DIAGNOSTIC UNILATERAL RIGHT MAMMOGRAM WITH TOMO AND CAD
4 series · 4 of 12 positions shown · non-contrast
Comparison: Previous exam(s).

CLINICAL DATA: The patient was called back for a mass in the right
breast at approximately 1 o'clock.

EXAM:
DIGITAL DIAGNOSTIC RIGHT MAMMOGRAM WITH TOMO
ULTRASOUND RIGHT BREAST

[R MLO synth-2D]
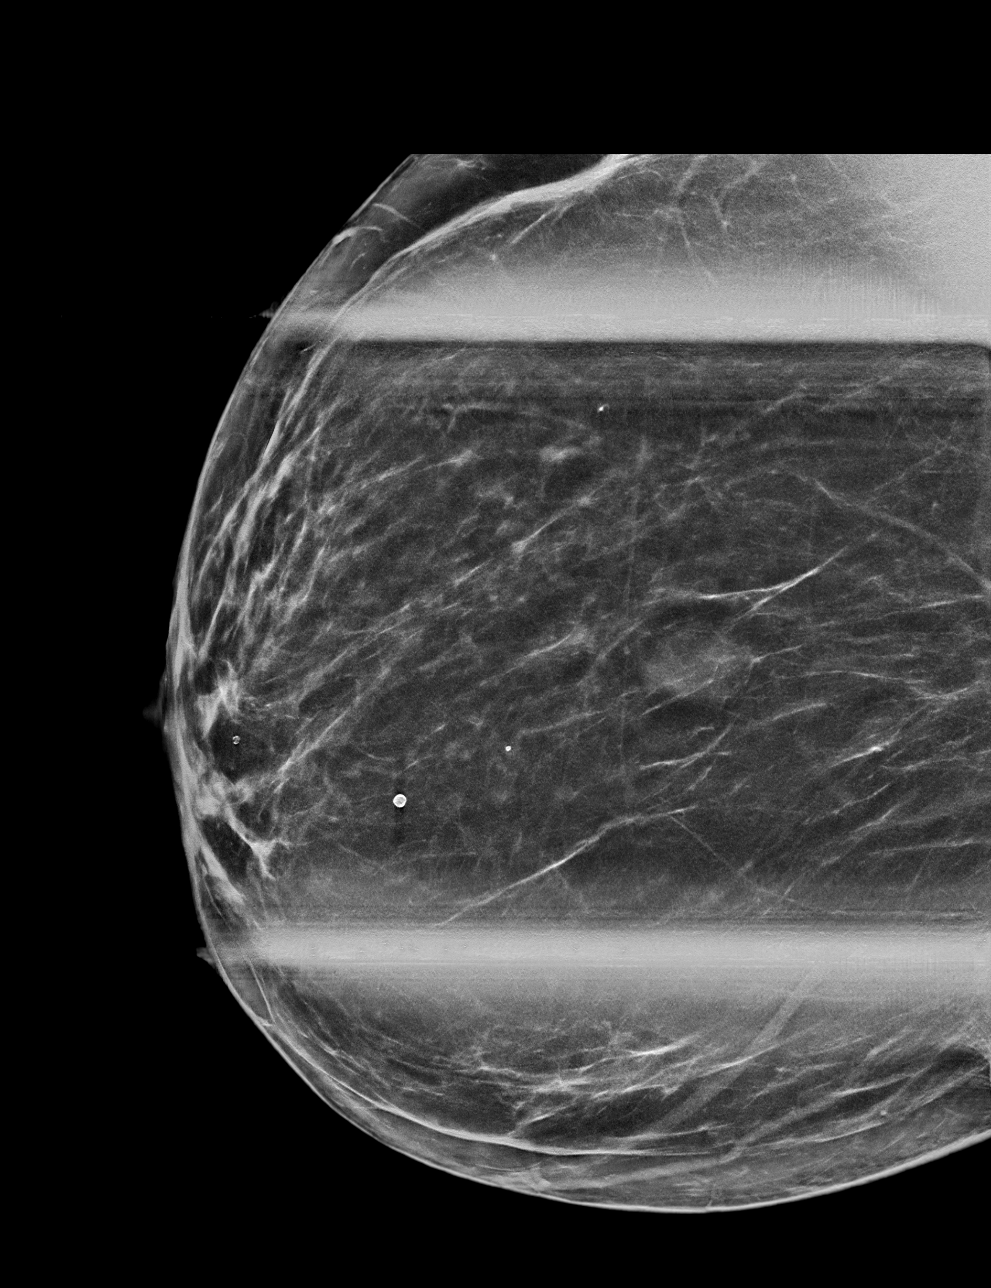

[R CC synth-2D]
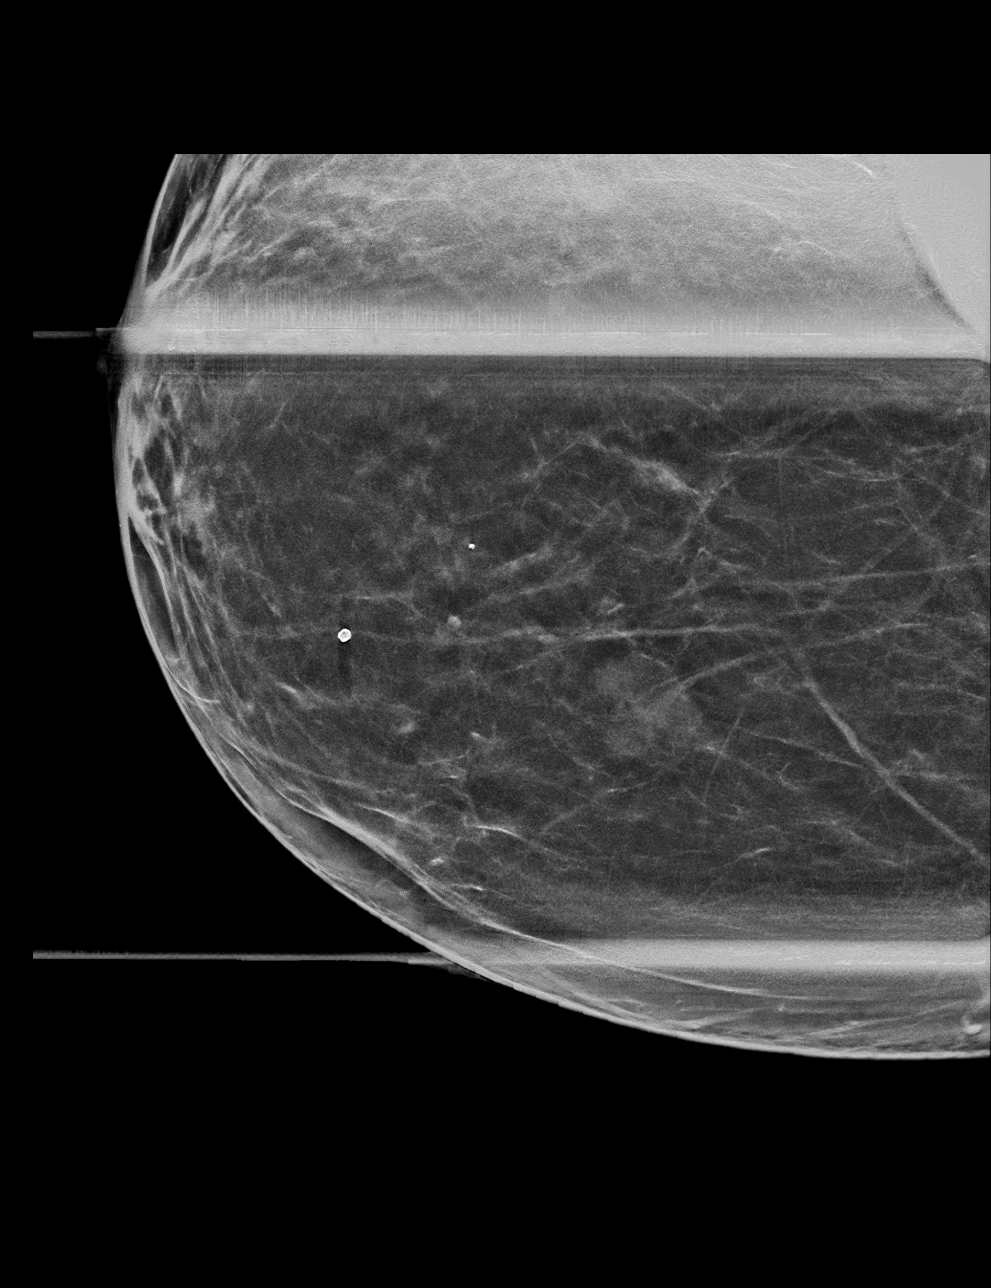

[R CC tomo · tomo slice 31/62.0]
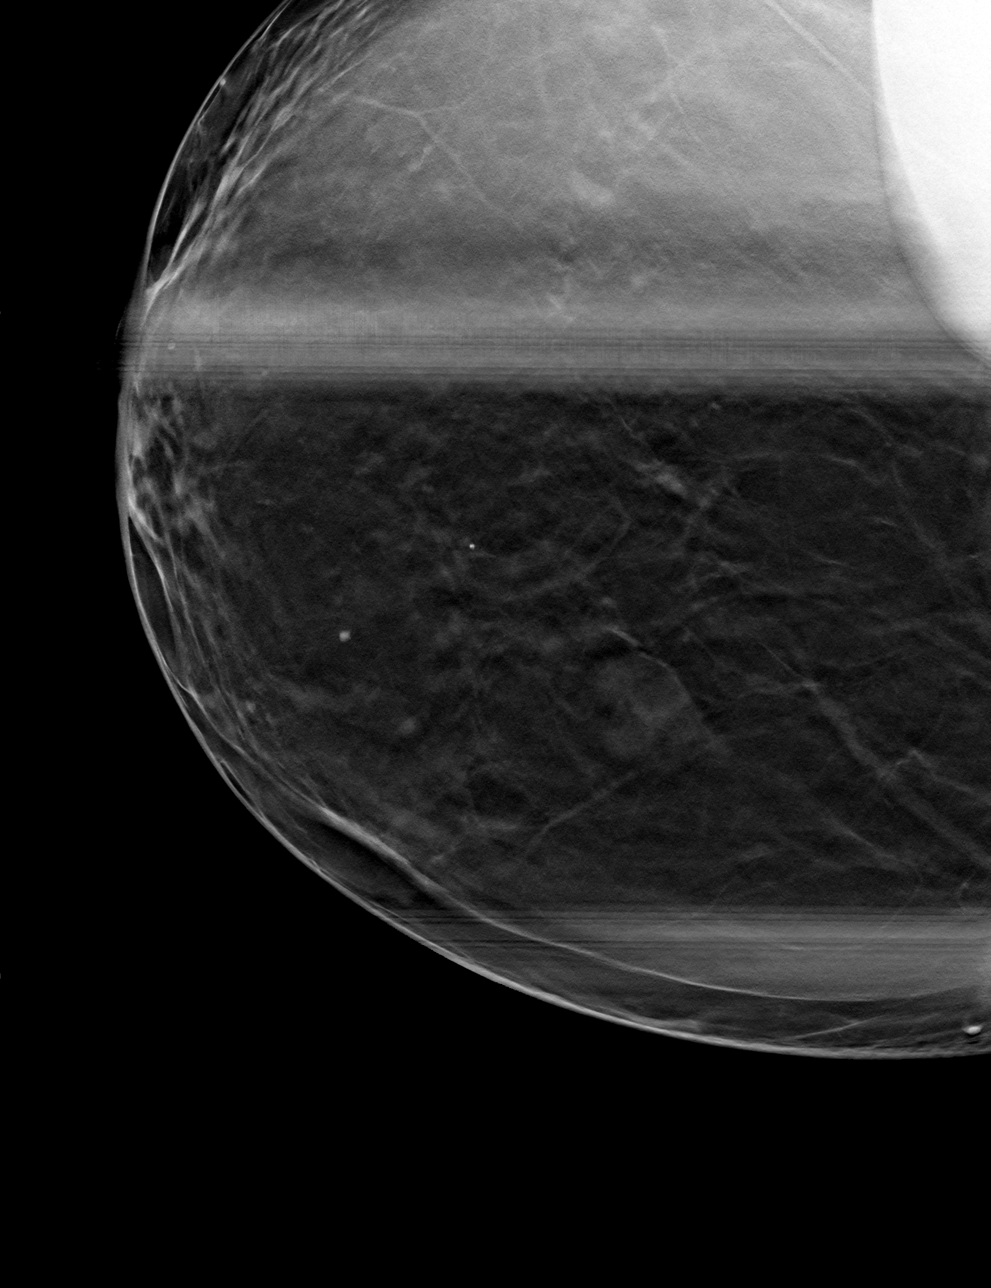

[R MLO tomo · tomo slice 37/74.0]
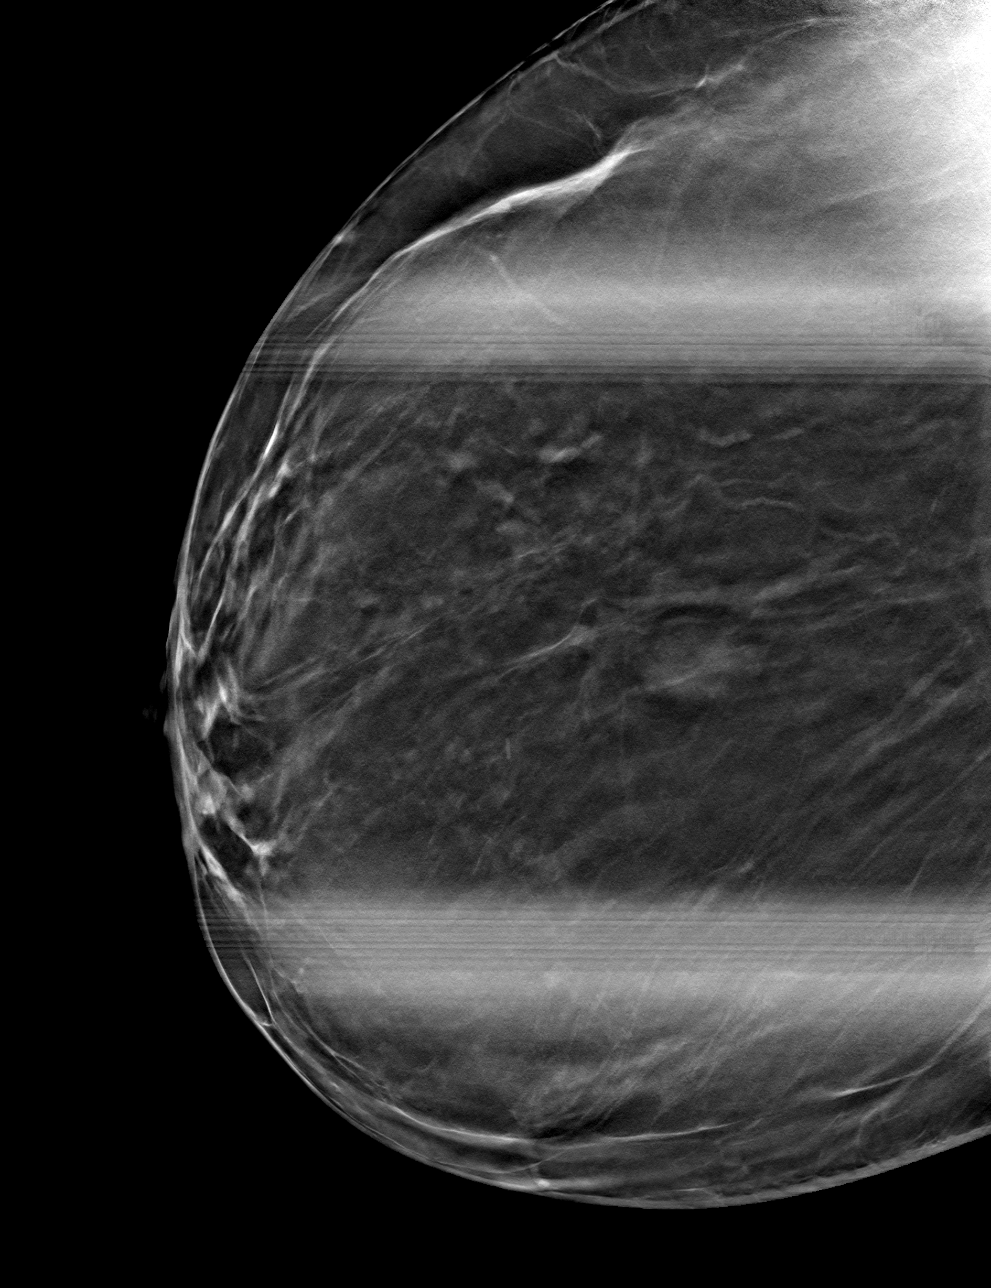

[4 of 12 positions shown; findings below may reference images not displayed]

ACR Breast Density Category b: There are scattered areas of
fibroglandular density.
FINDINGS: The mass in the medial central right breast persists on additional
imaging.

On physical exam, no suspicious lumps are identified.

Targeted ultrasound is performed, showing a primarily hypoechoic
mass in the right breast at 1 o'clock, 5 cm from the nipple
measuring 1.9 by 1.0 by 1.9 cm with internal blood flow and a
hyperechoic rim. No axillary adenopathy.
IMPRESSION: Indeterminate right breast mass at 1 o'clock, 5 cm from the nipple.

RECOMMENDATION:
Recommend ultrasound-guided biopsy of the right breast mass.

I have discussed the findings and recommendations with the patient.
Results were also provided in writing at the conclusion of the
visit. If applicable, a reminder letter will be sent to the patient
regarding the next appointment.

BI-RADS CATEGORY  4: Suspicious.

## 2020-06-06 IMAGING — US ULTRASOUND RIGHT BREAST LIMITED
1 series · 14 of 16 positions shown · non-contrast
Comparison: Previous exam(s).

CLINICAL DATA: The patient was called back for a mass in the right
breast at approximately 1 o'clock.

EXAM:
DIGITAL DIAGNOSTIC RIGHT MAMMOGRAM WITH TOMO
ULTRASOUND RIGHT BREAST

[Series 1: ultrasound right breast limited · 0.06mm/px · 14 of 16 slices shown]
[im 1/16]
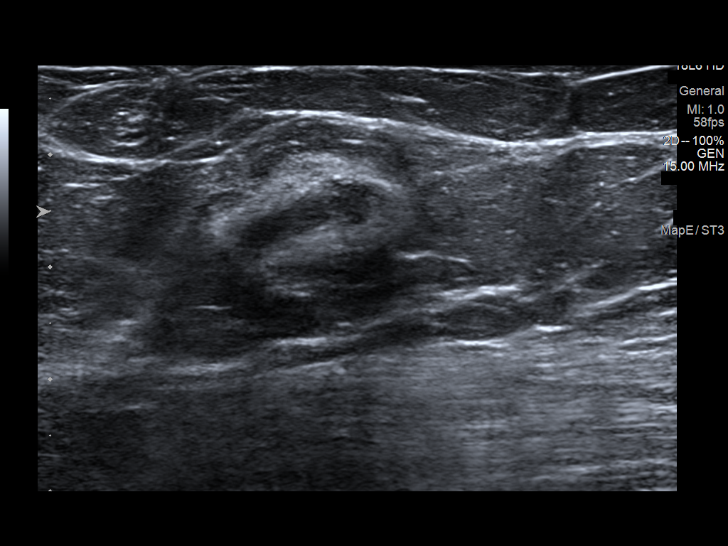
[im 2/16]
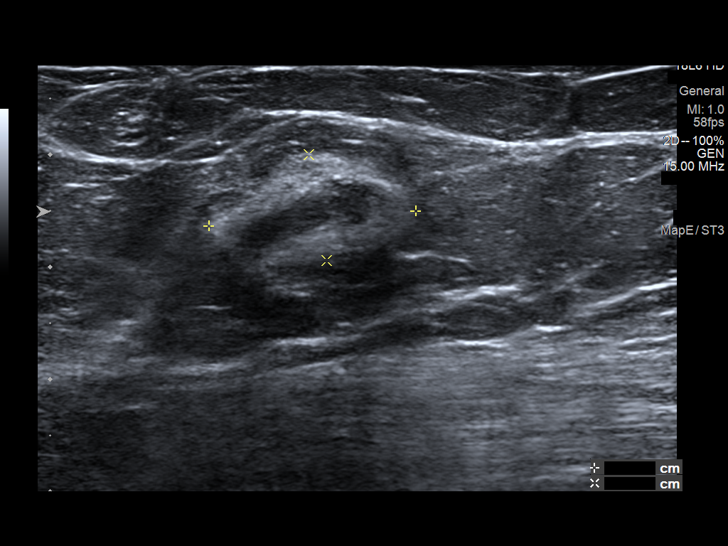
[im 3/16]
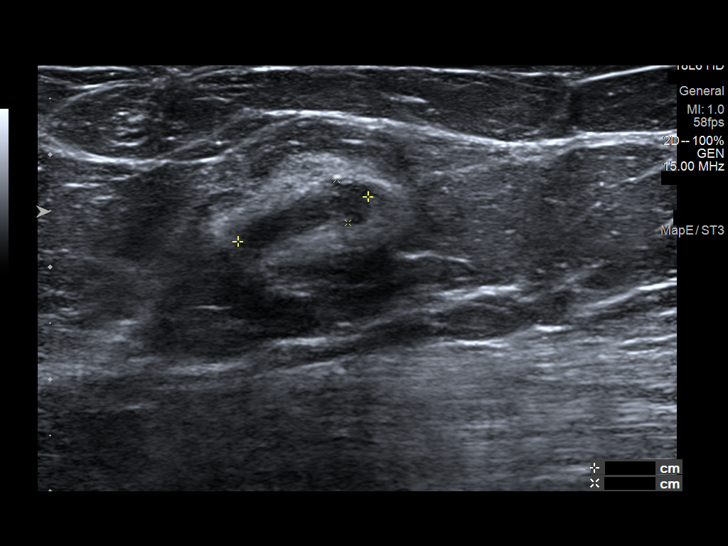
[im 5/16]
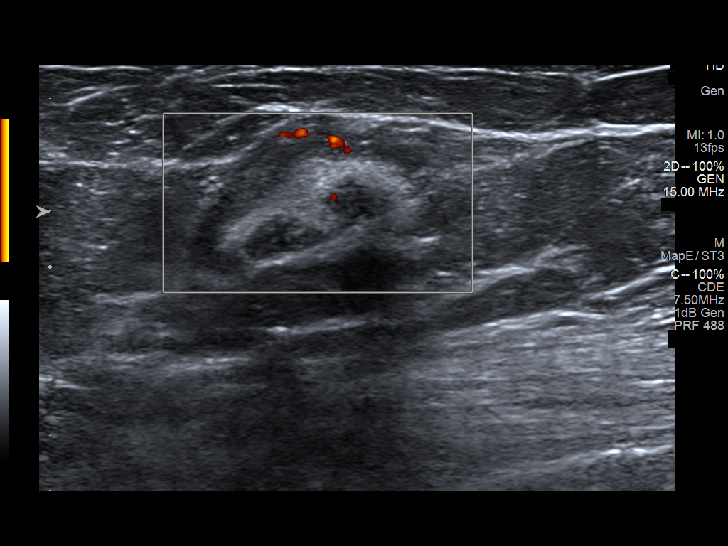
[im 6/16]
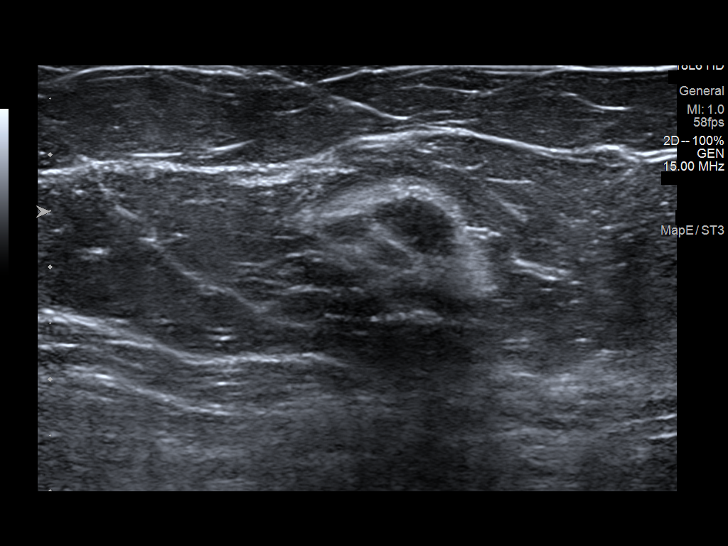
[im 7/16]
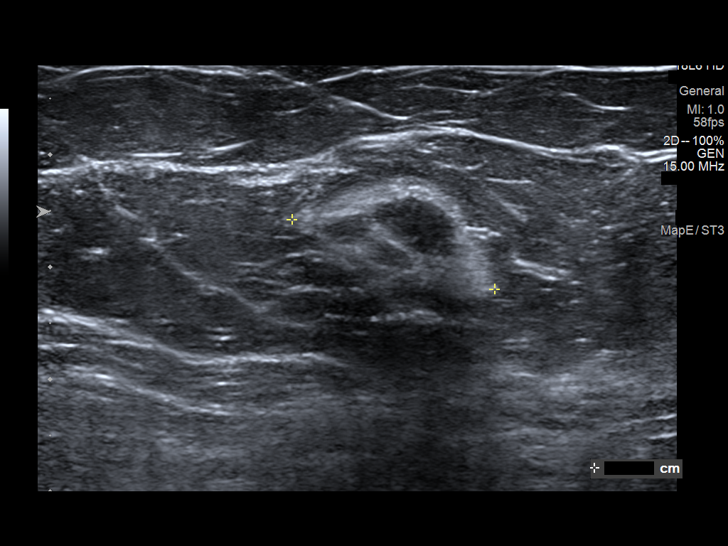
[im 8/16]
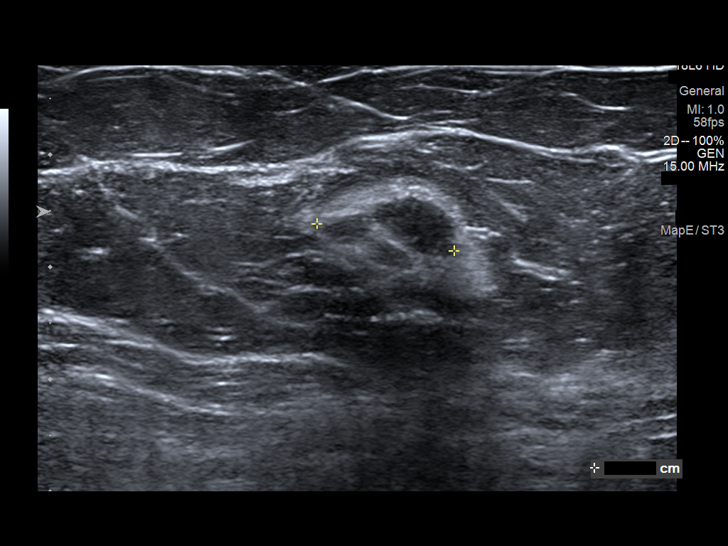
[im 9/16]
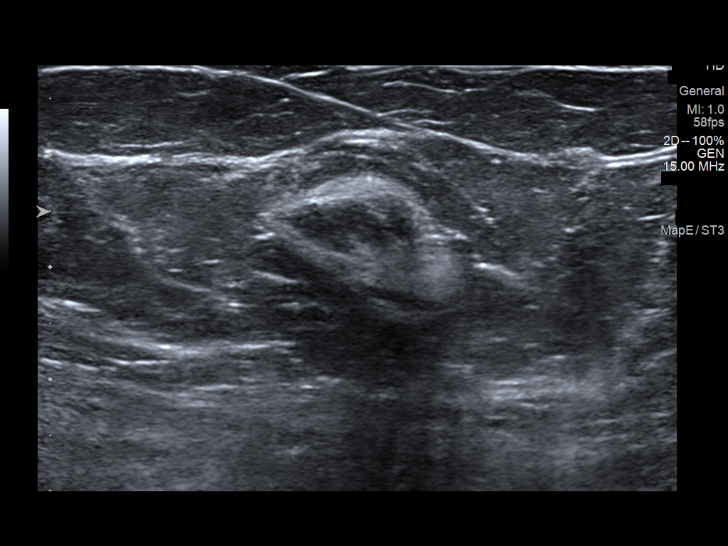
[im 10/16]
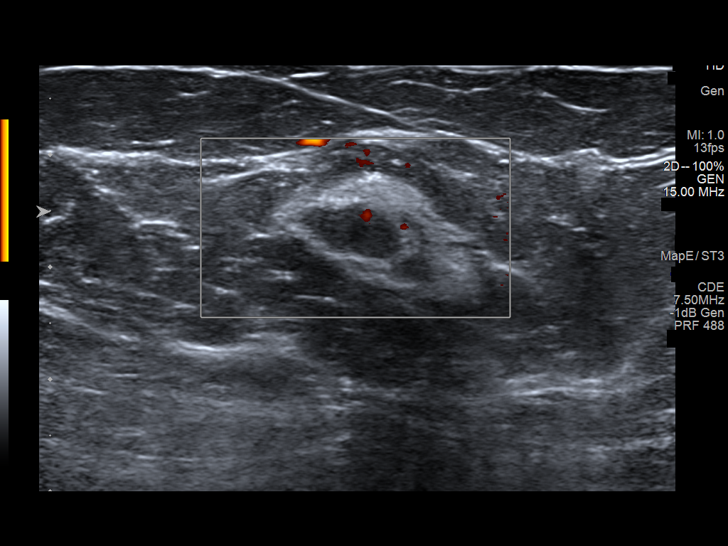
[im 11/16]
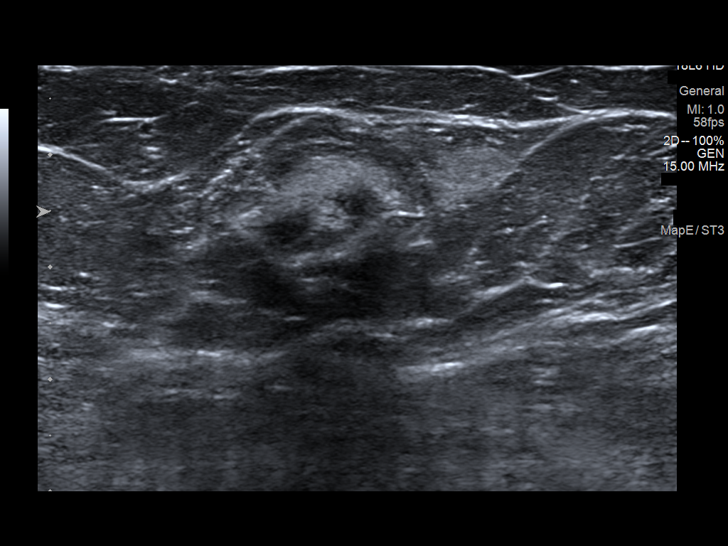
[im 13/16]
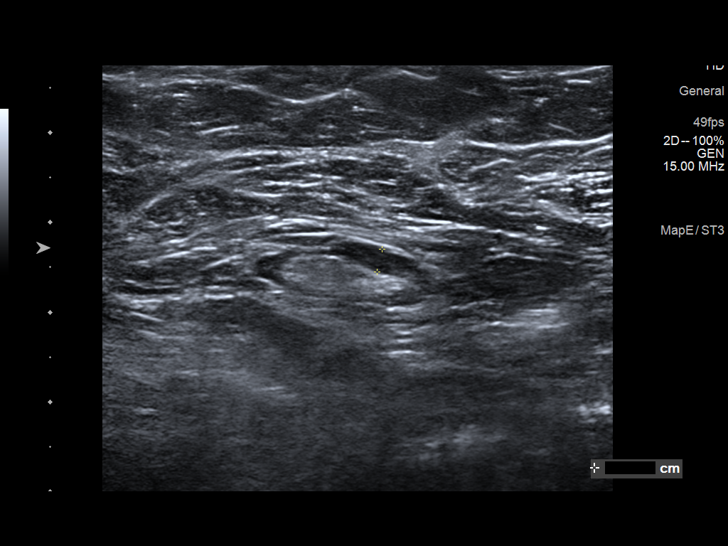
[im 14/16]
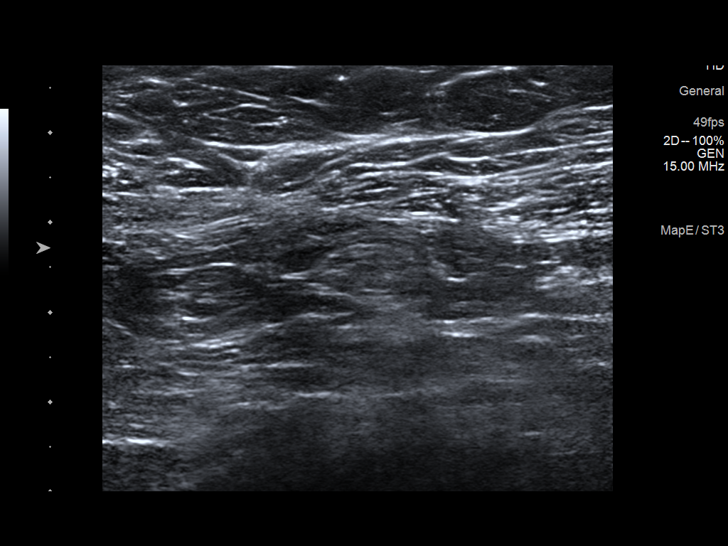
[im 15/16]
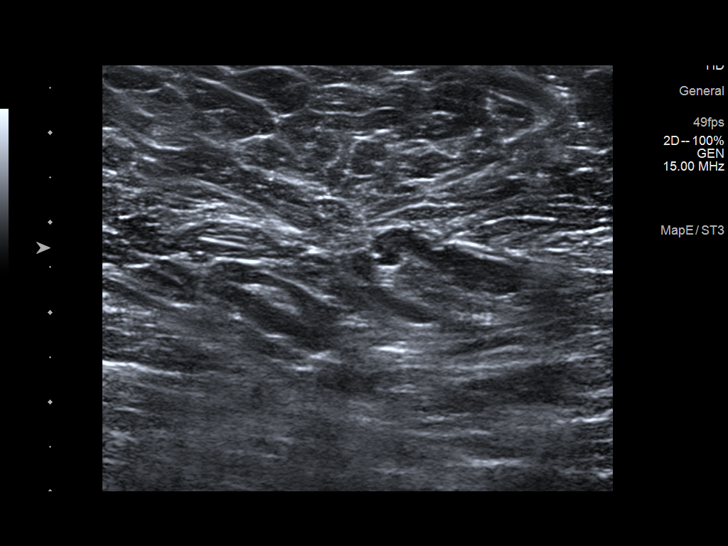
[im 16/16]
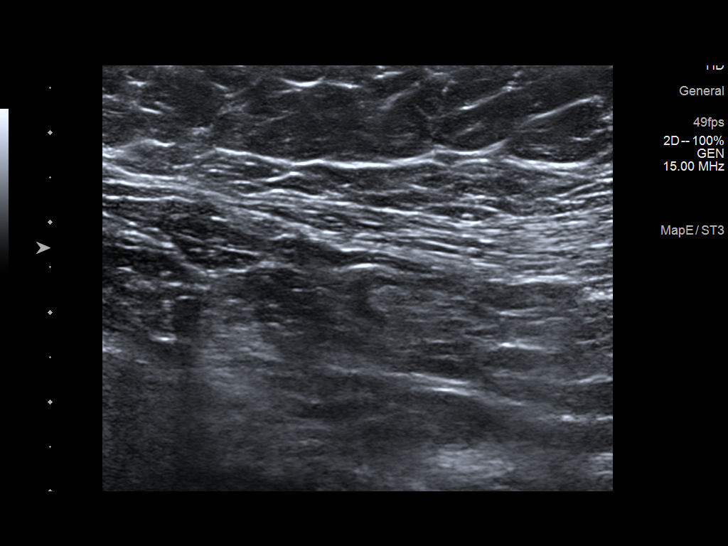

[14 of 16 positions shown; findings below may reference images not displayed]

ACR Breast Density Category b: There are scattered areas of
fibroglandular density.
FINDINGS: The mass in the medial central right breast persists on additional
imaging.

On physical exam, no suspicious lumps are identified.

Targeted ultrasound is performed, showing a primarily hypoechoic
mass in the right breast at 1 o'clock, 5 cm from the nipple
measuring 1.9 by 1.0 by 1.9 cm with internal blood flow and a
hyperechoic rim. No axillary adenopathy.
IMPRESSION: Indeterminate right breast mass at 1 o'clock, 5 cm from the nipple.

RECOMMENDATION:
Recommend ultrasound-guided biopsy of the right breast mass.

I have discussed the findings and recommendations with the patient.
Results were also provided in writing at the conclusion of the
visit. If applicable, a reminder letter will be sent to the patient
regarding the next appointment.

BI-RADS CATEGORY  4: Suspicious.

## 2020-06-07 ENCOUNTER — Ambulatory Visit: Payer: PRIVATE HEALTH INSURANCE | Admitting: Internal Medicine

## 2020-06-10 ENCOUNTER — Encounter: Payer: Self-pay | Admitting: Family Medicine

## 2020-06-10 MED ORDER — OLMESARTAN MEDOXOMIL 20 MG PO TABS
20.0000 mg | ORAL_TABLET | Freq: Every day | ORAL | 0 refills | Status: DC
Start: 2020-06-10 — End: 2020-07-09

## 2020-06-10 MED FILL — SPIRONOLACTONE 25 MG TABS: 25 | 30 days supply | Qty: 30 | Fill #5

## 2020-06-10 MED FILL — OLMESARTAN MEDOXOMIL 20 MG: 20 | 30 days supply | Qty: 30 | Fill #3

## 2020-06-11 ENCOUNTER — Encounter: Payer: BC Managed Care – PPO | Admitting: Family Medicine

## 2020-07-05 NOTE — Patient Instructions (Addendum)
You will hear from [facility] endocrinology to schedule a visit about your thyroid  As discussed, you can get the Shingrix vaccine here.  Check with your insurance beforehand.  I do recommend that you get your flu shot and your Covid booster soon.  Work on limiting carbohydrates and eat a low-fat diet.  Try to get at least 150 minutes of vigorous physical activity each week.  Remember to contact the breast center about tenderness in the right axilla.    Preventive Care 79-30 Years Old, Female Preventive care refers to visits with your health care provider and lifestyle choices that can promote health and wellness. This includes:  A yearly physical exam. This may also be called an annual well check.  Regular dental visits and eye exams.  Immunizations.  Screening for certain conditions.  Healthy lifestyle choices, such as eating a healthy diet, getting regular exercise, not using drugs or products that contain nicotine and tobacco, and limiting alcohol use. What can I expect for my preventive care visit? Physical exam Your health care provider will check your:  Height and weight. This may be used to calculate body mass index (BMI), which tells if you are at a healthy weight.  Heart rate and blood pressure.  Skin for abnormal spots. Counseling Your health care provider may ask you questions about your:  Alcohol, tobacco, and drug use.  Emotional well-being.  Home and relationship well-being.  Sexual activity.  Eating habits.  Work and work Statistician.  Method of birth control.  Menstrual cycle.  Pregnancy history. What immunizations do I need?  Influenza (flu) vaccine  This is recommended every year. Tetanus, diphtheria, and pertussis (Tdap) vaccine  You may need a Td booster every 10 years. Varicella (chickenpox) vaccine  You may need this if you have not been vaccinated. Zoster (shingles) vaccine  You may need this after age 79. Measles, mumps, and  rubella (MMR) vaccine  You may need at least one dose of MMR if you were born in 1957 or later. You may also need a second dose. Pneumococcal conjugate (PCV13) vaccine  You may need this if you have certain conditions and were not previously vaccinated. Pneumococcal polysaccharide (PPSV23) vaccine  You may need one or two doses if you smoke cigarettes or if you have certain conditions. Meningococcal conjugate (MenACWY) vaccine  You may need this if you have certain conditions. Hepatitis A vaccine  You may need this if you have certain conditions or if you travel or work in places where you may be exposed to hepatitis A. Hepatitis B vaccine  You may need this if you have certain conditions or if you travel or work in places where you may be exposed to hepatitis B. Haemophilus influenzae type b (Hib) vaccine  You may need this if you have certain conditions. Human papillomavirus (HPV) vaccine  If recommended by your health care provider, you may need three doses over 6 months. You may receive vaccines as individual doses or as more than one vaccine together in one shot (combination vaccines). Talk with your health care provider about the risks and benefits of combination vaccines. What tests do I need? Blood tests  Lipid and cholesterol levels. These may be checked every 5 years, or more frequently if you are over 52 years old.  Hepatitis C test.  Hepatitis B test. Screening  Lung cancer screening. You may have this screening every year starting at age 36 if you have a 30-pack-year history of smoking and currently smoke or  have quit within the past 15 years.  Colorectal cancer screening. All adults should have this screening starting at age 57 and continuing until age 39. Your health care provider may recommend screening at age 5 if you are at increased risk. You will have tests every 1-10 years, depending on your results and the type of screening test.  Diabetes screening. This  is done by checking your blood sugar (glucose) after you have not eaten for a while (fasting). You may have this done every 1-3 years.  Mammogram. This may be done every 1-2 years. Talk with your health care provider about when you should start having regular mammograms. This may depend on whether you have a family history of breast cancer.  BRCA-related cancer screening. This may be done if you have a family history of breast, ovarian, tubal, or peritoneal cancers.  Pelvic exam and Pap test. This may be done every 3 years starting at age 72. Starting at age 62, this may be done every 5 years if you have a Pap test in combination with an HPV test. Other tests  Sexually transmitted disease (STD) testing.  Bone density scan. This is done to screen for osteoporosis. You may have this scan if you are at high risk for osteoporosis. Follow these instructions at home: Eating and drinking  Eat a diet that includes fresh fruits and vegetables, whole grains, lean protein, and low-fat dairy.  Take vitamin and mineral supplements as recommended by your health care provider.  Do not drink alcohol if: ? Your health care provider tells you not to drink. ? You are pregnant, may be pregnant, or are planning to become pregnant.  If you drink alcohol: ? Limit how much you have to 0-1 drink a day. ? Be aware of how much alcohol is in your drink. In the [place] one drink equals one 12 oz bottle of beer (355 mL), one 5 oz glass of wine (148 mL), or one 1 oz glass of hard liquor (44 mL). Lifestyle  Take daily care of your teeth and gums.  Stay active. Exercise for at least 30 minutes on 5 or more days each week.  Do not use any products that contain nicotine or tobacco, such as cigarettes, e-cigarettes, and chewing tobacco. If you need help quitting, ask your health care provider.  If you are sexually active, practice safe sex. Use a condom or other form of birth control (contraception) in order to prevent  pregnancy and STIs (sexually transmitted infections).  If told by your health care provider, take low-dose aspirin daily starting at age 109. What's next?  Visit your health care provider once a year for a well check visit.  Ask your health care provider how often you should have your eyes and teeth checked.  Stay up to date on all vaccines. This information is not intended to replace advice given to you by your health care provider. Make sure you discuss any questions you have with your health care provider. Document Revised: 04/04/2018 Document Reviewed: 04/04/2018 Elsevier Patient Education  2020 [employer].

## 2020-07-05 NOTE — Progress Notes (Signed)
Subjective:    Patient ID: [name], female    DOB: Oct 09, 1965, 54 y.o.   MRN: [id number]  HPI Chief Complaint  Patient presents with  . fasting cpe    fasting cpe, no concerns- will get flu shot at work. sees obgyn   She is fairly new to the practice and here for a complete physical exam. Previous medical care: Last CPE: 2019   Other providers: Neurologist- [place] [facility] [place] care- Dr. Katy Fitch  [facility]   Mild OSA and is considering seeing a dentist for a dental appliance.   History of subclinical hyperthyroidism and multiple nodules on [place] in 2019. She did not follow up for recommended fine needle biopsy. She was seeing endocrinologist in [place]. She would like to establish with a local endocrinologist. Reports being asymptomatic.   Vitamin D def- taking a supplement  HTN- doing well on medication   She is aware that her BMI is elevated and is working on weight loss.   Prediabetes- working on low sugar diet  Social history: Lives with her husband, 6 children (1 biological),  18 grandchildren, works at [employer]  Diet: states sweets are her weakness  Exercise: getting more active   Immunizations: UTD. Discussed Shingrix   Health maintenance:  Mammogram: UTD Colonoscopy: UTD Last Gynecological Exam: October 2020  Last Menstrual cycle: 2014 Last Dental Exam: Dr. Adair Laundry in [place] Exam: 04/2020  Wears seatbelt always, smoke detectors in home and functioning, does not text while driving and feels safe in home environment.   Reviewed allergies, medications, past medical, surgical, family, and social history.   Review of Systems Review of Systems Constitutional: -fever, -chills, -sweats, -unexpected weight change,-fatigue ENT: -runny nose, -ear pain, -sore throat Cardiology:  -chest pain, -palpitations, -edema Respiratory: -cough,  -shortness of breath, -wheezing Gastroenterology: -abdominal pain, -nausea, -vomiting, -diarrhea, -constipation  Hematology: -bleeding or bruising problems Musculoskeletal: -arthralgias, -myalgias, -joint swelling, -back pain Ophthalmology: -vision changes Urology: -dysuria, -difficulty urinating, -hematuria, -urinary frequency, -urgency Neurology: -headache, -weakness, -tingling, -numbness       Objective:   Physical Exam BP 120/70   Pulse 77   Ht 5' 3.5" (1.613 m)   Wt 225 lb 3.2 oz (102.2 kg)   LMP 03/17/2013   BMI 39.27 kg/m   General Appearance:    Alert, cooperative, no distress, appears stated age  Head:    Normocephalic, without obvious abnormality, atraumatic  Eyes:    PERRL, conjunctiva/corneas clear, EOM's intact  Ears:    Normal TM's and external ear canals  Nose:   Mask on   Throat:   Mask on   Neck:   Supple, no lymphadenopathy;  thyroid:  Nodule palpated on right, no enlargement or tenderness; no JVD  Back:    Spine nontender, no curvature, ROM normal, no CVA     tenderness  Lungs:     Clear to auscultation bilaterally without wheezes, rales or     ronchi; respirations unlabored  Chest Wall:    No tenderness or deformity   Heart:    Regular rate and rhythm, S1 and S2 normal, no murmur, rub   or gallop  Breast Exam:    OB/GYN  Abdomen:     Soft, non-tender, nondistended, normoactive bowel sounds,    no masses, no hepatosplenomegaly  Genitalia:    OB/GYN     Extremities:   No clubbing, cyanosis or edema  Pulses:   2+ and symmetric all extremities  Skin:   Skin color, texture, turgor normal, no rashes or lesions  Lymph nodes:   Cervical, supraclavicular, and axillary nodes normal  Neurologic:   CNII-XII intact, normal strength, sensation and gait; reflexes 2+ and symmetric throughout          Psych:   Normal mood, affect, hygiene and grooming.        Assessment & Plan:  Routine general medical examination at a health care facility - Plan: Lipid  panel -Preventive health care reviewed and she is up-to-date. She sees her OB/GYN. Counseling on healthy lifestyle including diet and exercise. Immunizations reviewed. Recommend Shingrix vaccine and she will check on this. Discussed safety and health promotion  Essential hypertension, benign -Blood pressure well controlled. Continue medication and low-salt diet  Prediabetes -Recommend low sugar, low carbohydrate diet and exercise  Vitamin D deficiency -Vitamin D level in September was low normal. She may increase her vitamin D supplement by 1000 IUs daily  Obesity (BMI 30-39.9) - Plan: Lipid panel -She is working on weight loss. Discussed potential long-term health consequences associated with obesity. Counseling on healthy diet and exercise  Need for hepatitis C screening test - Plan: Hepatitis C antibody -Done per screening guidelines  Subclinical hyperthyroidism - Plan: Ambulatory referral to Endocrinology -Review of her EMR shows this has been an issue since 2019 with no follow-up. TSH was low with normal free T4 and T3 in September. Asymptomatic. I will refer her to endocrinology to establish care and for further evaluation and treatment  Multiple thyroid nodules - Plan: Ambulatory referral to Endocrinology -See above note  Screening for HIV without presence of risk factors - Plan: HIV Antibody (routine testing w rflx) -Done per screening guidelines  Immunization counseling

## 2020-07-06 ENCOUNTER — Other Ambulatory Visit: Payer: Self-pay

## 2020-07-06 ENCOUNTER — Encounter: Payer: Self-pay | Admitting: Family Medicine

## 2020-07-06 ENCOUNTER — Ambulatory Visit (INDEPENDENT_AMBULATORY_CARE_PROVIDER_SITE_OTHER): Payer: BC Managed Care – PPO | Admitting: Family Medicine

## 2020-07-06 VITALS — BP 120/70 | HR 77 | Ht 63.5 in | Wt 225.2 lb

## 2020-07-06 DIAGNOSIS — Z114 Encounter for screening for human immunodeficiency virus [HIV]: Secondary | ICD-10-CM

## 2020-07-06 DIAGNOSIS — E559 Vitamin D deficiency, unspecified: Secondary | ICD-10-CM

## 2020-07-06 DIAGNOSIS — E0590 Thyrotoxicosis, unspecified without thyrotoxic crisis or storm: Secondary | ICD-10-CM

## 2020-07-06 DIAGNOSIS — Z1159 Encounter for screening for other viral diseases: Secondary | ICD-10-CM | POA: Diagnosis not present

## 2020-07-06 DIAGNOSIS — Z0000 Encounter for general adult medical examination without abnormal findings: Secondary | ICD-10-CM

## 2020-07-06 DIAGNOSIS — E042 Nontoxic multinodular goiter: Secondary | ICD-10-CM

## 2020-07-06 DIAGNOSIS — I10 Essential (primary) hypertension: Secondary | ICD-10-CM

## 2020-07-06 DIAGNOSIS — R7303 Prediabetes: Secondary | ICD-10-CM | POA: Diagnosis not present

## 2020-07-06 DIAGNOSIS — Z7185 Encounter for immunization safety counseling: Secondary | ICD-10-CM

## 2020-07-06 DIAGNOSIS — E669 Obesity, unspecified: Secondary | ICD-10-CM | POA: Diagnosis not present

## 2020-07-07 LAB — LIPID PANEL
Chol/HDL Ratio: 4.6 ratio — ABNORMAL HIGH (ref 0.0–4.4)
Cholesterol, Total: 198 mg/dL (ref 100–199)
HDL: 43 mg/dL (ref >39)
LDL Chol Calc (NIH): 139 mg/dL — ABNORMAL HIGH (ref 0–99)
Triglycerides: 86 mg/dL (ref 0–149)
VLDL Cholesterol Cal: 16 mg/dL (ref 5–40)

## 2020-07-07 LAB — HIV ANTIBODY (ROUTINE TESTING W REFLEX): HIV Screen 4th Generation wRfx: NONREACTIVE

## 2020-07-07 LAB — HEPATITIS C ANTIBODY: Hep C Virus Ab: <0.1 s/co ratio (ref 0.0–0.9)

## 2020-07-08 ENCOUNTER — Encounter: Payer: Self-pay | Admitting: Family Medicine

## 2020-07-09 ENCOUNTER — Encounter: Payer: Self-pay | Admitting: Family Medicine

## 2020-07-09 ENCOUNTER — Other Ambulatory Visit: Payer: Self-pay | Admitting: Family Medicine

## 2020-07-09 MED ORDER — OLMESARTAN MEDOXOMIL 20 MG PO TABS: 20.0000 mg | ORAL_TABLET | Freq: Every day | ORAL | 1 refills | Status: DC

## 2020-07-09 MED ORDER — SPIRONOLACTONE 25 MG PO TABS
25.0000 mg | ORAL_TABLET | Freq: Every day | ORAL | 1 refills | Status: DC
Start: 2020-07-09 — End: 2020-07-09

## 2020-07-09 MED FILL — SPIRONOLACTONE 25 MG TABS: 25 | 90 days supply | Qty: 90 | Fill #0

## 2020-07-09 MED FILL — OLMESARTAN MEDOXOMIL 20 MG: 20 | 90 days supply | Qty: 90 | Fill #0

## 2020-07-13 ENCOUNTER — Encounter: Payer: Self-pay | Admitting: Family Medicine

## 2020-07-13 NOTE — Progress Notes (Signed)
She has not checked her mychart message. Please contact her with the results.

## 2020-08-04 ENCOUNTER — Encounter: Payer: Self-pay | Admitting: Family Medicine

## 2020-08-05 ENCOUNTER — Encounter: Payer: Self-pay | Admitting: Family Medicine

## 2020-08-05 ENCOUNTER — Other Ambulatory Visit (INDEPENDENT_AMBULATORY_CARE_PROVIDER_SITE_OTHER): Payer: BC Managed Care – PPO

## 2020-08-05 ENCOUNTER — Telehealth: Payer: BC Managed Care – PPO | Admitting: Family Medicine

## 2020-08-05 ENCOUNTER — Telehealth: Payer: Self-pay | Admitting: Family Medicine

## 2020-08-05 VITALS — Wt 224.0 lb

## 2020-08-05 DIAGNOSIS — R519 Headache, unspecified: Secondary | ICD-10-CM

## 2020-08-05 DIAGNOSIS — J3489 Other specified disorders of nose and nasal sinuses: Secondary | ICD-10-CM

## 2020-08-05 DIAGNOSIS — R0981 Nasal congestion: Secondary | ICD-10-CM

## 2020-08-05 DIAGNOSIS — R52 Pain, unspecified: Secondary | ICD-10-CM

## 2020-08-05 DIAGNOSIS — R058 Other specified cough: Secondary | ICD-10-CM

## 2020-08-05 DIAGNOSIS — R5383 Other fatigue: Secondary | ICD-10-CM

## 2020-08-05 LAB — POCT INFLUENZA A/B
Influenza A, POC: NEGATIVE
Influenza B, POC: NEGATIVE

## 2020-08-05 LAB — POC COVID19 BINAXNOW: SARS Coronavirus 2 Ag: POSITIVE — AB

## 2020-08-05 NOTE — Telephone Encounter (Signed)
Pt tested positive and has questions about quaratine etc  Please call

## 2020-08-05 NOTE — Progress Notes (Signed)
   Subjective:  Documentation for virtual audio and video telecommunications through Caregility encounter:  The patient was located at home. 2 patient identifiers used.  The provider was located in the office. The patient did consent to this visit and is aware of possible charges through their insurance for this visit.  The other persons participating in this telemedicine service were none. Time spent on call was 15 minutes and in review of previous records >20 minutes total.  This virtual service is not related to other E/M service within previous 7 days.   Patient ID: [name], female    DOB: Dec 13, 1965, 54 y.o.   MRN: [id number]  HPI Chief Complaint  Patient presents with  . sick    Sick- started Monday, sore throat, cough, muscle pain, body aches, runny nose, congested, sob, really bad headache   Complains of a 4 day history of fatigue, body aches, sore throat, rhinorrhea, nasal congestion, ear pain,  headache, chest congestion, dry cough and shortness of breath only with going up steps.   Flu shot and 2 Covid vaccines with the last one April.   Taking Tylenol and Advil multi-symptom.    She has underlying asthma but has not had to use her inhaler except for once 2 days ago.   Review of Systems Pertinent positives and negatives in the history of present illness.     Objective:   Physical Exam Wt 224 lb (101.6 kg)   LMP 03/17/2013   BMI 39.06 kg/m   Alert and oriented and in no acute distress.  Respirations unlabored.  Speaking in complete sentences without difficulty.      Assessment & Plan:  Fatigue, unspecified type - Plan: POC COVID-19, Influenza A/B, Novel Coronavirus, NAA ([facility])  Body aches - Plan: POC COVID-19, Influenza A/B, Novel Coronavirus, NAA ([facility])  Nasal congestion with rhinorrhea - Plan: POC COVID-19, Influenza A/B, Novel Coronavirus, NAA ([facility])  Dry cough - Plan: POC COVID-19, Influenza A/B, Novel Coronavirus, NAA  ([facility])  Acute nonintractable headache, unspecified headache type - Plan: POC COVID-19, Influenza A/B, Novel Coronavirus, NAA ([facility])  This will be a 2 part visit with her coming to our office parking lot later this morning for Covid rapid and PCR as well as flu testing.  Advised to quarantine.  Discussed symptomatic treatment. I will follow-up pending her results.

## 2020-08-05 NOTE — Telephone Encounter (Signed)
Spoke with patient and answered her questions.

## 2020-08-08 ENCOUNTER — Encounter: Payer: Self-pay | Admitting: Family Medicine

## 2020-08-09 ENCOUNTER — Telehealth (INDEPENDENT_AMBULATORY_CARE_PROVIDER_SITE_OTHER): Payer: BC Managed Care – PPO | Admitting: Family Medicine

## 2020-08-09 ENCOUNTER — Encounter: Payer: Self-pay | Admitting: Family Medicine

## 2020-08-09 ENCOUNTER — Other Ambulatory Visit: Payer: Self-pay | Admitting: Family Medicine

## 2020-08-09 VITALS — Temp 98.4°F

## 2020-08-09 DIAGNOSIS — J0140 Acute pansinusitis, unspecified: Secondary | ICD-10-CM | POA: Diagnosis not present

## 2020-08-09 DIAGNOSIS — U071 COVID-19: Secondary | ICD-10-CM | POA: Diagnosis not present

## 2020-08-09 MED ORDER — AMOXICILLIN 875 MG PO TABS: 875.0000 mg | ORAL_TABLET | Freq: Two times a day (BID) | ORAL | 0 refills | Status: DC

## 2020-08-09 MED FILL — AMOXICILLIN 875 MG TABS: 875 | 10 days supply | Qty: 20 | Fill #0

## 2020-08-09 NOTE — Progress Notes (Signed)
   Subjective:  Documentation for virtual audio and video telecommunications through Caregility encounter:  The patient was located at home. 2 patient identifiers used.  The provider was located in the office. The patient did consent to this visit and is aware of possible charges through their insurance for this visit.  The other persons participating in this telemedicine service were none. Time spent on call was 12 minutes and in review of previous records >20 minutes total.  This virtual service is not related to other E/M service within previous 7 days.   Patient ID: [name], female    DOB: 11-18-1965, 55 y.o.   MRN: [id number]  HPI Chief Complaint  Patient presents with  . follow-up    Follow-up on covid, feeling better but still having congestion, ear pain, sinus pressure   This is a visit for worsening or changing symptoms with Covid infection.  Diagnosed with Covid on 08/05/2020 and symptom onset was 08/01/2020.   Reports sinus pressure, nasal congestion, post nasal drainage, ear pain bilaterally, and mild cough or spitting up dark sputum.   She can taste but her sense of smell is lessened.   Taking Sudafed and Mucinex.   No fever, chills, body aches, chest pain, palpitations, shortness of breath, abdominal pain, N/V/D.    Review of Systems Pertinent positives and negatives in the history of present illness.     Objective:   Physical Exam Temp 98.4 F (36.9 C)   LMP 03/17/2013   Alert and oriented in no acute distress.  Sinus tenderness present when she palpates her sinuses.  Postnasal drainage with thick sputum.  Respirations unlabored.  Speaking in complete sentences without difficulty      Assessment & Plan:  COVID-19 virus infection  Acute non-recurrent pansinusitis - Plan: amoxicillin (AMOXIL) 875 MG tablet  She will continue with symptomatic treatment.  I am suspicious that she has sinusitis and I will send in an antibiotic for her.   Continue good hydration.  Recommend taking a probiotic or eating [place] yogurt with live cultures while on the antibiotic.  She will let me know if she is worsening or not back to baseline when she completes the antibiotic.  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

## 2020-08-10 ENCOUNTER — Encounter: Payer: Self-pay | Admitting: Family Medicine

## 2020-08-11 ENCOUNTER — Encounter: Payer: Self-pay | Admitting: Internal Medicine

## 2020-08-12 ENCOUNTER — Encounter: Payer: Self-pay | Admitting: Family Medicine

## 2020-08-20 ENCOUNTER — Encounter: Payer: Self-pay | Admitting: Family Medicine

## 2020-08-26 ENCOUNTER — Other Ambulatory Visit: Payer: Self-pay | Admitting: Family Medicine

## 2020-08-26 ENCOUNTER — Encounter: Payer: Self-pay | Admitting: Family Medicine

## 2020-08-26 MED ORDER — PREDNISONE 10 MG (21) PO TBPK
ORAL_TABLET | Freq: Every day | ORAL | 0 refills | Status: DC
Start: 2020-08-26 — End: 2020-09-21

## 2020-08-26 MED FILL — predniSONE 10 MG TABS: 10 | 6 days supply | Qty: 21 | Fill #0

## 2020-09-06 ENCOUNTER — Encounter: Payer: Self-pay | Admitting: Family Medicine

## 2020-09-07 ENCOUNTER — Ambulatory Visit (INDEPENDENT_AMBULATORY_CARE_PROVIDER_SITE_OTHER): Payer: BC Managed Care – PPO

## 2020-09-07 ENCOUNTER — Other Ambulatory Visit: Payer: Self-pay | Admitting: Family Medicine

## 2020-09-07 ENCOUNTER — Other Ambulatory Visit: Payer: Self-pay

## 2020-09-07 VITALS — BP 116/75 | HR 98

## 2020-09-07 DIAGNOSIS — R300 Dysuria: Secondary | ICD-10-CM

## 2020-09-07 LAB — POCT URINALYSIS DIPSTICK

## 2020-09-07 MED ORDER — SULFAMETHOXAZOLE-TRIMETHOPRIM 800-160 MG PO TABS: 1.0000 | ORAL_TABLET | Freq: Two times a day (BID) | ORAL | 0 refills | Status: DC

## 2020-09-07 MED FILL — SULFAMETHOXAZOLE-TMP DS TAB: 800-160 | 3 days supply | Qty: 6 | Fill #0

## 2020-09-07 NOTE — Progress Notes (Signed)
Patient seen and assessed by nursing staff.  Agree with documentation and plan.  

## 2020-09-07 NOTE — Progress Notes (Signed)
SUBJECTIVE: [name] is a 55 y.o. female who complains of burning while urinating and pressure x1 days, without flank pain, fever, chills, or abnormal vaginal discharge or bleeding.   OBJECTIVE: Appears well, in no apparent distress.  Vital signs are normal. Urine dipstick shows positive for leukocytes.    ASSESSMENT: Dysuria   PLAN: Treatment per orders.  Call or return to clinic prn if these symptoms worsen or fail to improve as anticipated.

## 2020-09-08 ENCOUNTER — Encounter: Payer: Self-pay | Admitting: Family Medicine

## 2020-09-10 LAB — URINE CULTURE

## 2020-09-21 ENCOUNTER — Ambulatory Visit (INDEPENDENT_AMBULATORY_CARE_PROVIDER_SITE_OTHER): Payer: BC Managed Care – PPO | Admitting: Obstetrics & Gynecology

## 2020-09-21 ENCOUNTER — Encounter: Payer: Self-pay | Admitting: Obstetrics & Gynecology

## 2020-09-21 ENCOUNTER — Other Ambulatory Visit: Payer: Self-pay

## 2020-09-21 VITALS — BP 114/73 | HR 72 | Ht 63.0 in | Wt 224.0 lb

## 2020-09-21 DIAGNOSIS — R599 Enlarged lymph nodes, unspecified: Secondary | ICD-10-CM | POA: Diagnosis not present

## 2020-09-21 NOTE — Progress Notes (Signed)
   GYNECOLOGY OFFICE VISIT NOTE  History:   [name] is a 55 y.o. G1P0 here today for persistent pain of right axillary lymph node.  History of COVID vaccination in right arm in April 2021, mammogram in 01/01/2020 showed enlarged 32mm lymph node which was noted on subsequent ultrasound done 03/08/2020. Biopsy on 04/09/2020 showed benign lymph node.  She reports continued pain and tenderness and feels it might be getting bigger.  Desires re-evaluation. She denies any abnormal vaginal discharge, bleeding, pelvic pain or other concerns.    Past Medical History:  Diagnosis Date  . Asthma    no inhaler  . Hypertension   . PONV (postoperative nausea and vomiting) 2006    Past Surgical History:  Procedure Laterality Date  . BREAST BIOPSY Right 11/2018   benign  . CESAREAN SECTION  1995  . HYSTEROSCOPY N/A 12/13/2012   Procedure: HYSTEROSCOPY/IUD REMOVAL;  Surgeon: Marvene Staff, MD;  Location: [facility];  Service: Gynecology;  Laterality: N/A;  . TUBAL LIGATION  2006    The following portions of the patient's history were reviewed and updated as appropriate: allergies, current medications, past family history, past medical history, past social history, past surgical history and problem list.   Health Maintenance:  Normal pap and negative HRHPV on 06/04/2019.  Normal mammogram on 04/2020.   Review of Systems:  Pertinent items noted in HPI and remainder of comprehensive ROS otherwise negative.  Physical Exam:  BP 114/73   Pulse 72   Ht 5\' 3"  (1.6 m)   Wt 224 lb (101.6 kg)   LMP 03/17/2013   BMI 39.68 kg/m  CONSTITUTIONAL: Well-developed, well-nourished female in no acute distress.  CARDIOVASCULAR: Normal heart rate noted RESPIRATORY:  Effort and breath sounds normal, no problems with respiration noted. BREASTS: Symmetric in size. No masses, tenderness, skin changes or nipple drainage bilaterally. No left axillary lymphadenopathy. Tender area in inferior portion of right  axillary region, unable to palpate discrete lesion due to pain, enlarged tender area around lymph node is about 5 cm x 6 cm. No erythema, no drainage.  ABDOMEN: Soft, no distention noted. PELVIC:Deferred     Assessment and Plan:      1. Enlarged lymph node of right axilla Continued pain, will get further evaluation and refer to [facility]. Analgesia as needed for pain control - US BREAST LTD UNI RIGHT INC AXILLA; Future - MM DIAG BREAST TOMO BILATERAL; Future  Routine preventative health maintenance measures emphasized. Please refer to After Visit Summary for other counseling recommendations.   Return for any gynecologic concerns.    I spent 15 minutes dedicated to the care of this patient including pre-visit review of records, face to face time with the patient discussing her conditions and treatments and post visit ordering of testing.    Verita Schneiders, MD, [place] for [employer], [place]

## 2020-09-22 ENCOUNTER — Encounter: Payer: Self-pay | Admitting: Family Medicine

## 2020-09-22 ENCOUNTER — Telehealth: Payer: Self-pay | Admitting: Family Medicine

## 2020-09-22 NOTE — Telephone Encounter (Addendum)
I have faxed to them the date diagnosed and the diagnosis code needed at [phone] with fax confirmation received.

## 2020-09-22 NOTE — Telephone Encounter (Signed)
Pt has called asking that the date she was diagnosed with sleep apena and the type be faxed to her dentist Randolm Idol DDS [phone] the office # is [phone]

## 2020-09-23 ENCOUNTER — Ambulatory Visit: Payer: BC Managed Care – PPO | Admitting: Internal Medicine

## 2020-10-05 ENCOUNTER — Encounter: Payer: Self-pay | Admitting: Family Medicine

## 2020-10-06 MED FILL — OLMESARTAN MEDOXOMIL 20 MG: 20 | 90 days supply | Qty: 90 | Fill #1

## 2020-10-06 MED FILL — SPIRONOLACTONE 25 MG TABS: 25 | 90 days supply | Qty: 90 | Fill #1

## 2020-10-15 ENCOUNTER — Other Ambulatory Visit: Payer: Self-pay

## 2020-10-18 ENCOUNTER — Ambulatory Visit: Payer: BC Managed Care – PPO | Admitting: Internal Medicine

## 2020-10-18 ENCOUNTER — Other Ambulatory Visit: Payer: Self-pay

## 2020-10-18 ENCOUNTER — Encounter: Payer: Self-pay | Admitting: Internal Medicine

## 2020-10-18 VITALS — BP 114/72 | HR 76 | Ht 63.0 in | Wt 224.5 lb

## 2020-10-18 DIAGNOSIS — E0590 Thyrotoxicosis, unspecified without thyrotoxic crisis or storm: Secondary | ICD-10-CM | POA: Diagnosis not present

## 2020-10-18 DIAGNOSIS — E041 Nontoxic single thyroid nodule: Secondary | ICD-10-CM | POA: Diagnosis not present

## 2020-10-18 LAB — CBC WITH DIFFERENTIAL/PLATELET
Basophils Absolute: 0.1 10*3/uL (ref 0.0–0.1)
Basophils Relative: 0.8 % (ref 0.0–3.0)
Eosinophils Absolute: 0.3 10*3/uL (ref 0.0–0.7)
Eosinophils Relative: 3.6 % (ref 0.0–5.0)
HCT: 39.7 % (ref 36.0–46.0)
Hemoglobin: 13.7 g/dL (ref 12.0–15.0)
Lymphocytes Relative: 38.1 % (ref 12.0–46.0)
Lymphs Abs: 2.9 10*3/uL (ref 0.7–4.0)
MCHC: 34.5 g/dL (ref 30.0–36.0)
MCV: 81.9 fl (ref 78.0–100.0)
Monocytes Absolute: 0.5 10*3/uL (ref 0.1–1.0)
Monocytes Relative: 6.7 % (ref 3.0–12.0)
Neutro Abs: 3.8 10*3/uL (ref 1.4–7.7)
Neutrophils Relative %: 50.8 % (ref 43.0–77.0)
Platelets: 350 10*3/uL (ref 150.0–400.0)
RBC: 4.85 Mil/uL (ref 3.87–5.11)
RDW: 13.9 % (ref 11.5–15.5)
WBC: 7.5 10*3/uL (ref 4.0–10.5)

## 2020-10-18 LAB — COMPREHENSIVE METABOLIC PANEL
ALT: 16 U/L (ref 0–35)
AST: 17 U/L (ref 0–37)
Albumin: 4.1 g/dL (ref 3.5–5.2)
Alkaline Phosphatase: 43 U/L (ref 39–117)
BUN: 13 mg/dL (ref 6–23)
CO2: 28 mEq/L (ref 19–32)
Calcium: 9.9 mg/dL (ref 8.4–10.5)
Chloride: 102 mEq/L (ref 96–112)
Creatinine, Ser: 0.74 mg/dL (ref 0.40–1.20)
GFR: 91.14 mL/min (ref >60.00)
Glucose, Bld: 107 mg/dL — ABNORMAL HIGH (ref 70–99)
Potassium: 3.9 mEq/L (ref 3.5–5.1)
Sodium: 137 mEq/L (ref 135–145)
Total Bilirubin: 0.4 mg/dL (ref 0.2–1.2)
Total Protein: 7.3 g/dL (ref 6.0–8.3)

## 2020-10-18 LAB — TSH: TSH: 0.53 u[IU]/mL (ref 0.35–4.50)

## 2020-10-18 LAB — T4, FREE: Free T4: 1.1 ng/dL (ref 0.60–1.60)

## 2020-10-18 NOTE — Patient Instructions (Signed)
Thyroid Uptake and Scan works like this: We would first check a thyroid "scan" (a special, but easy and painless type of thyroid x ray).  you go to the x-ray department of the hospital to swallow a pill, which contains a miniscule amount of radiation.  You will not notice any symptoms from this.  You will go back to the x-ray department the next day, to lie down in front of a camera.  The results of this will be sent to me.    

## 2020-10-18 NOTE — Progress Notes (Signed)
Name: [name]  MRN/ DOB: [id number], 1965/12/05    Age/ Sex: 55 y.o., female    PCP: Girtha Rm, NP-C   Reason for Endocrinology Evaluation: Subclinical Hyperthyroidism     Date of Initial Endocrinology Evaluation: 10/18/2020     HPI: [name] is a 55 y.o. female with a past medical history of HTN and . The patient presented for initial endocrinology clinic visit on 10/18/2020 for consultative assistance with her Subclinical Hyperthyroidism    She has been noted to have low TSH since 05/2018 with a nadir of 0.327 uIU/mL with normal FT4 and T3.   No prior treatment   Denies weight loss  Denies diarrhea , palpitation,  Has occasional anxiety but no jittery sensation Denies local neck symptoms  No FH of thyroid disease   No biotin intake  No prior exposure to radiation    She has no Hx of cardiac arrhythmia  No Hx of osteoporosis      HISTORY:  Past Medical History:  Past Medical History:  Diagnosis Date  . Asthma    no inhaler  . Hypertension   . PONV (postoperative nausea and vomiting) 2006   Past Surgical History:  Past Surgical History:  Procedure Laterality Date  . BREAST BIOPSY Right 11/2018   benign  . CESAREAN SECTION  1995  . HYSTEROSCOPY N/A 12/13/2012   Procedure: HYSTEROSCOPY/IUD REMOVAL;  Surgeon: Marvene Staff, MD;  Location: [facility];  Service: Gynecology;  Laterality: N/A;  . TUBAL LIGATION  2006    Social History:  reports that she quit smoking about 8 years ago. Her smoking use included cigarettes. She has a 16.50 pack-year smoking history. She has never used smokeless tobacco. She reports that she does not drink alcohol and does not use drugs. Family History: family history includes Alcohol abuse in her father; Hypertension in her mother.   HOME MEDICATIONS: Allergies as of 10/18/2020      Reactions   Peanut-containing Drug Products Itching   Walnuts, pecans (tree nuts)   Shellfish Allergy  Hives, Itching      Medication List       Accurate as of October 18, 2020  3:27 PM. If you have any questions, ask your nurse or doctor.        B-12 PO Take by mouth.   olmesartan 20 MG tablet Commonly known as: BENICAR Take 1 tablet (20 mg total) by mouth daily.   ProAir RespiClick 030 (90 Base) MCG/ACT Aepb Generic drug: Albuterol Sulfate Inhale 2 puffs into the lungs every 4 (four) hours as needed.   spironolactone 25 MG tablet Commonly known as: ALDACTONE Take 1 tablet (25 mg total) by mouth daily.   Vitamin D (Cholecalciferol) 25 MCG (1000 UT) Caps Take 1,000 mg by mouth daily.         REVIEW OF SYSTEMS: A comprehensive ROS was conducted with the patient and is negative except as per HPI and below:  ROS     OBJECTIVE:  VS: BP 114/72   Pulse 76   Ht 5\' 3"  (1.6 m)   Wt 224 lb 8 oz (101.8 kg)   LMP 03/17/2013   SpO2 98%   BMI 39.77 kg/m    Wt Readings from Last 3 Encounters:  10/18/20 224 lb 8 oz (101.8 kg)  09/21/20 224 lb (101.6 kg)  08/05/20 224 lb (101.6 kg)     EXAM: General: Pt appears well and is in NAD  Neck: General: Supple without  adenopathy. Thyroid: Right thyroid nodule appreciated. No thyroid bruit.  Lungs: Clear with good BS bilat with no rales, rhonchi, or wheezes  Heart: Auscultation: RRR.  Abdomen: Normoactive bowel sounds, soft, nontender, without masses or organomegaly palpable  Extremities:  BL LE: No pretibial edema normal ROM and strength.  Skin: Hair: Texture and amount normal with gender appropriate distribution Skin Inspection: No rashes Skin Palpation: Skin temperature, texture, and thickness normal to palpation  Neuro: Cranial nerves: II - XII grossly intact  Motor: Normal strength throughout DTRs: 2+ and symmetric in UE without delay in relaxation phase  Mental Status: Judgment, insight: Intact Orientation: Oriented to time, place, and person Mood and affect: No depression, anxiety, or agitation     DATA  REVIEWED: Results for Shelby, Hall (MRN [id number]) as of 10/18/2020 15:30  Ref. Range 10/18/2020 09:30  Sodium Latest Ref Range: 135 - 145 mEq/L 137  Potassium Latest Ref Range: 3.5 - 5.1 mEq/L 3.9  Chloride Latest Ref Range: 96 - 112 mEq/L 102  CO2 Latest Ref Range: 19 - 32 mEq/L 28  Glucose Latest Ref Range: 70 - 99 mg/dL 107 (H)  BUN Latest Ref Range: 6 - 23 mg/dL 13  Creatinine Latest Ref Range: 0.40 - 1.20 mg/dL 0.74  Calcium Latest Ref Range: 8.4 - 10.5 mg/dL 9.9  Alkaline Phosphatase Latest Ref Range: 39 - 117 U/L 43  Albumin Latest Ref Range: 3.5 - 5.2 g/dL 4.1  AST Latest Ref Range: 0 - 37 U/L 17  ALT Latest Ref Range: 0 - 35 U/L 16  Total Protein Latest Ref Range: 6.0 - 8.3 g/dL 7.3  Total Bilirubin Latest Ref Range: 0.2 - 1.2 mg/dL 0.4  GFR Latest Ref Range: >60.00 mL/min 91.14  WBC Latest Ref Range: 4.0 - 10.5 K/uL 7.5  RBC Latest Ref Range: 3.87 - 5.11 Mil/uL 4.85  Hemoglobin Latest Ref Range: 12.0 - 15.0 g/dL 13.7  HCT Latest Ref Range: 36.0 - 46.0 % 39.7  MCV Latest Ref Range: 78.0 - 100.0 fl 81.9  MCHC Latest Ref Range: 30.0 - 36.0 g/dL 34.5  RDW Latest Ref Range: 11.5 - 15.5 % 13.9  Platelets Latest Ref Range: 150.0 - 400.0 K/uL 350.0  Neutrophils Latest Ref Range: 43.0 - 77.0 % 50.8  Lymphocytes Latest Ref Range: 12.0 - 46.0 % 38.1  Monocytes Relative Latest Ref Range: 3.0 - 12.0 % 6.7  Eosinophil Latest Ref Range: 0.0 - 5.0 % 3.6  Basophil Latest Ref Range: 0.0 - 3.0 % 0.8  NEUT# Latest Ref Range: 1.4 - 7.7 K/uL 3.8  Lymphocyte # Latest Ref Range: 0.7 - 4.0 K/uL 2.9  Monocyte # Latest Ref Range: 0.1 - 1.0 K/uL 0.5  Eosinophils Absolute Latest Ref Range: 0.0 - 0.7 K/uL 0.3  Basophils Absolute Latest Ref Range: 0.0 - 0.1 K/uL 0.1  TSH Latest Ref Range: 0.35 - 4.50 uIU/mL 0.53  T4,Free(Direct) Latest Ref Range: 0.60 - 1.60 ng/dL 1.10      ASSESSMENT/PLAN/RECOMMENDATIONS:   Subclinical Hyperthyroidism   - Pt is clinically euthyroid  The causes  of subclinical hyperthyroidism are the same as the causes of overt hyperthyroidism, and like overt hyperthyroidism, subclinical hyperthyroidism can be persistent or transient. Common causes of subclinical hyperthyroidism include autonomously functioning thyroid adenomas and multinodular goiters , or Graves' disease    - Most patients with subclinical hyperthyroidism have no clinical manifestations of hyperthyroidism, and those symptoms that are present (eg, tachycardia, tremor, dyspnea on exertion, weight loss) are mild and nonspecific." However, subclinical hyperthyroidism is associated  with an increased risk of atrial fibrillation and, primarily in postmenopausal women, a decrease in bone mineral density. - Repeat labs today, will hold off on Thyroid uptake and scan    2. Right thyroid nodule :   - This was on exam, pt under the impression she had an Ultrasound > 10 yrs ago with a diagnosis of thyroid nodules.  - No prior biopsy  - Will proceed with thyroid ultrasound    F/U in 3 months   Signed electronically by: Mack Guise, MD  [facility] End[facility]  [facility] [address] [place], [place] [address] Phone: [phone] FAX: [phone]   CC: Girtha Rm, NP-C [place] [address] Phone: [phone] Fax: [phone]   Return to Endocrinology clinic as below: Future Appointments  Date Time Provider [place]  11/03/2020  1:20 PM [facility] DIAG TOMO 1 [facility] GI-BREAST CE  11/03/2020  1:30 PM [facility] [place] 1 [facility] GI-BREAST CE  01/28/2021  7:30 AM Aviv Rota, Melanie Crazier, MD [facility] None

## 2020-10-20 LAB — TRAB (TSH RECEPTOR BINDING ANTIBODY): TRAB: 1.19 IU/L (ref <=2.00)

## 2020-10-20 LAB — T3: T3, Total: 122 ng/dL (ref 76–181)

## 2020-11-03 ENCOUNTER — Ambulatory Visit
Admission: RE | Admit: 2020-11-03 | Discharge: 2020-11-03 | Disposition: A | Discharge status: Home / Self Care | Payer: BC Managed Care – PPO | Source: Ambulatory Visit | Attending: Obstetrics & Gynecology | Admitting: Obstetrics & Gynecology

## 2020-11-03 ENCOUNTER — Other Ambulatory Visit: Payer: Self-pay

## 2020-11-03 DIAGNOSIS — R922 Inconclusive mammogram: Secondary | ICD-10-CM | POA: Diagnosis not present

## 2020-11-03 DIAGNOSIS — N644 Mastodynia: Secondary | ICD-10-CM | POA: Diagnosis not present

## 2020-11-03 DIAGNOSIS — R599 Enlarged lymph nodes, unspecified: Secondary | ICD-10-CM

## 2020-11-03 DIAGNOSIS — N6489 Other specified disorders of breast: Secondary | ICD-10-CM | POA: Diagnosis not present

## 2020-11-04 ENCOUNTER — Other Ambulatory Visit: Payer: BC Managed Care – PPO

## 2020-11-10 ENCOUNTER — Encounter: Payer: Self-pay | Admitting: Family Medicine

## 2020-11-15 ENCOUNTER — Telehealth: Payer: Self-pay | Admitting: Internal Medicine

## 2020-11-15 ENCOUNTER — Ambulatory Visit
Admission: RE | Admit: 2020-11-15 | Discharge: 2020-11-15 | Disposition: A | Discharge status: Home / Self Care | Payer: BC Managed Care – PPO | Source: Ambulatory Visit | Attending: Internal Medicine | Admitting: Internal Medicine

## 2020-11-15 DIAGNOSIS — E0590 Thyrotoxicosis, unspecified without thyrotoxic crisis or storm: Secondary | ICD-10-CM | POA: Diagnosis not present

## 2020-11-15 DIAGNOSIS — E041 Nontoxic single thyroid nodule: Secondary | ICD-10-CM

## 2020-11-15 DIAGNOSIS — D492 Neoplasm of unspecified behavior of bone, soft tissue, and skin: Secondary | ICD-10-CM | POA: Diagnosis not present

## 2020-11-15 DIAGNOSIS — D23122 Other benign neoplasm of skin of left lower eyelid, including canthus: Secondary | ICD-10-CM | POA: Diagnosis not present

## 2020-11-15 NOTE — Telephone Encounter (Signed)
Discussed ultrasound results with the pt on 11/15/2020 at 1450   She is in agreement in proceeding with FNA of the right nodule 4.8 cm     Abby Nena Jordan, MD  [facility] Endocrinology  [facility] [address] [place], [place] [address] Phone: [phone] FAX: [phone]

## 2020-11-15 NOTE — Telephone Encounter (Signed)
Left a message to call back and discuss ultrasound results with the need to proceed with FNA   A portal message will be sent as well   Awaiting response   Abby Nena Jordan, MD  [facility] End[facility]  [facility] [address] [place],  [address] Phone: [phone] FAX: [phone]

## 2020-11-22 ENCOUNTER — Encounter: Payer: Self-pay | Admitting: Family Medicine

## 2020-11-23 ENCOUNTER — Encounter: Payer: Self-pay | Admitting: Internal Medicine

## 2020-11-23 NOTE — Telephone Encounter (Signed)
Could you help patient?

## 2020-12-01 ENCOUNTER — Other Ambulatory Visit: Payer: BC Managed Care – PPO

## 2020-12-02 ENCOUNTER — Other Ambulatory Visit: Payer: Self-pay

## 2020-12-02 ENCOUNTER — Encounter: Payer: Self-pay | Admitting: Family Medicine

## 2020-12-02 ENCOUNTER — Other Ambulatory Visit (HOSPITAL_COMMUNITY): Payer: Self-pay

## 2020-12-02 DIAGNOSIS — R300 Dysuria: Secondary | ICD-10-CM

## 2020-12-02 MED ORDER — SULFAMETHOXAZOLE-TRIMETHOPRIM 800-160 MG PO TABS
1.0000 | ORAL_TABLET | Freq: Two times a day (BID) | ORAL | 0 refills | Status: AC
  Filled 2020-12-02: qty 10, 5d supply, fill #0

## 2020-12-09 ENCOUNTER — Other Ambulatory Visit: Payer: Self-pay

## 2020-12-09 ENCOUNTER — Ambulatory Visit
Admission: RE | Admit: 2020-12-09 | Discharge: 2020-12-09 | Disposition: A | Discharge status: Home / Self Care | Payer: BC Managed Care – PPO | Source: Ambulatory Visit | Attending: Internal Medicine | Admitting: Internal Medicine

## 2020-12-09 ENCOUNTER — Other Ambulatory Visit (HOSPITAL_COMMUNITY)
Admission: RE | Admit: 2020-12-09 | Discharge: 2020-12-09 | Disposition: A | Discharge status: Home / Self Care | Payer: BC Managed Care – PPO | Source: Ambulatory Visit | Attending: Internal Medicine | Admitting: Internal Medicine

## 2020-12-09 DIAGNOSIS — D34 Benign neoplasm of thyroid gland: Secondary | ICD-10-CM | POA: Diagnosis not present

## 2020-12-09 DIAGNOSIS — E041 Nontoxic single thyroid nodule: Secondary | ICD-10-CM | POA: Diagnosis not present

## 2020-12-13 ENCOUNTER — Other Ambulatory Visit: Payer: Self-pay | Admitting: Family Medicine

## 2020-12-13 DIAGNOSIS — Z1231 Encounter for screening mammogram for malignant neoplasm of breast: Secondary | ICD-10-CM

## 2020-12-13 LAB — CYTOLOGY - NON PAP

## 2020-12-17 DIAGNOSIS — E0590 Thyrotoxicosis, unspecified without thyrotoxic crisis or storm: Secondary | ICD-10-CM | POA: Diagnosis not present

## 2020-12-17 DIAGNOSIS — E042 Nontoxic multinodular goiter: Secondary | ICD-10-CM | POA: Diagnosis not present

## 2021-01-03 ENCOUNTER — Encounter: Payer: Self-pay | Admitting: Family Medicine

## 2021-01-04 ENCOUNTER — Other Ambulatory Visit: Payer: Self-pay | Admitting: Family Medicine

## 2021-01-04 ENCOUNTER — Other Ambulatory Visit (HOSPITAL_COMMUNITY): Payer: Self-pay

## 2021-01-04 MED ORDER — CHLORHEXIDINE GLUCONATE 0.12 % MT SOLN
OROMUCOSAL | 1 refills | Status: DC
  Filled 2021-01-04: qty 473, 16d supply, fill #0

## 2021-01-04 MED ORDER — SPIRONOLACTONE 25 MG PO TABS
25.0000 mg | ORAL_TABLET | Freq: Every day | ORAL | 1 refills | Status: DC
  Filled 2021-01-04: qty 90, 90d supply, fill #0
  Filled 2021-04-08: qty 90, 90d supply, fill #1

## 2021-01-04 MED ORDER — OLMESARTAN MEDOXOMIL 20 MG PO TABS
20.0000 mg | ORAL_TABLET | Freq: Every day | ORAL | 1 refills | Status: DC
  Filled 2021-01-04: qty 90, 90d supply, fill #0
  Filled 2021-04-08: qty 90, 90d supply, fill #1

## 2021-01-05 ENCOUNTER — Other Ambulatory Visit (HOSPITAL_COMMUNITY): Payer: Self-pay

## 2021-01-19 ENCOUNTER — Encounter: Payer: Self-pay | Admitting: Family Medicine

## 2021-01-20 ENCOUNTER — Encounter: Payer: Self-pay | Admitting: Family Medicine

## 2021-01-24 ENCOUNTER — Ambulatory Visit: Payer: BC Managed Care – PPO | Admitting: Family Medicine

## 2021-01-24 ENCOUNTER — Encounter: Payer: Self-pay | Admitting: Family Medicine

## 2021-01-24 ENCOUNTER — Other Ambulatory Visit (HOSPITAL_COMMUNITY): Payer: Self-pay

## 2021-01-24 ENCOUNTER — Other Ambulatory Visit: Payer: Self-pay

## 2021-01-24 VITALS — BP 124/82 | HR 74 | Temp 98.0°F | Wt 226.4 lb

## 2021-01-24 DIAGNOSIS — H2513 Age-related nuclear cataract, bilateral: Secondary | ICD-10-CM | POA: Diagnosis not present

## 2021-01-24 DIAGNOSIS — L509 Urticaria, unspecified: Secondary | ICD-10-CM | POA: Diagnosis not present

## 2021-01-24 DIAGNOSIS — H04123 Dry eye syndrome of bilateral lacrimal glands: Secondary | ICD-10-CM | POA: Diagnosis not present

## 2021-01-24 DIAGNOSIS — D492 Neoplasm of unspecified behavior of bone, soft tissue, and skin: Secondary | ICD-10-CM | POA: Diagnosis not present

## 2021-01-24 DIAGNOSIS — H43811 Vitreous degeneration, right eye: Secondary | ICD-10-CM | POA: Diagnosis not present

## 2021-01-24 MED ORDER — LEVOCETIRIZINE DIHYDROCHLORIDE 5 MG PO TABS
5.0000 mg | ORAL_TABLET | Freq: Every evening | ORAL | 2 refills | Status: DC
  Filled 2021-01-24 (×2): qty 30, 30d supply, fill #0

## 2021-01-24 NOTE — Patient Instructions (Signed)
Take the Xyzal in the mornings. You can take Benadryl at bedtime.  Cool showers, baths and compresses.   Try to look for triggers.   We can add another allergy medication if needed in a couple of weeks.    Hives Hives (urticaria) are itchy, red, swollen areas on the skin. Hives can appear on any part of the body. Hives often fade within 24 hours (acute hives). Sometimes, new hives appear after old ones fade and the cycle can continue for several days or weeks (chronic hives). Hives do not spread from person to person (are not contagious). Hives come from the body's reaction to something a person is allergic to (allergen), something that causes irritation, or various other triggers. When a person is exposed to a trigger, his or her body releases a chemical (histamine) that causes redness, itching, and swelling. Hives can appear right afterexposure to a trigger or hours later. What are the causes? This condition may be caused by: Allergies to foods or ingredients. Insect bites or stings. Exposure to pollen or pets. Contact with latex or chemicals. Spending time in sunlight, heat, or cold (exposure). Exercise. Stress. Certain medicines. You can also get hives from other medical conditions and treatments, such as: Viruses, including the common cold. Bacterial infections, such as urinary tract infections and strep throat. Certain medicines. Allergy shots. Blood transfusions. Sometimes, the cause of this condition is not known (idiopathic hives). What increases the risk? You are more likely to develop this condition if you: Are a woman. Have food allergies, especially to citrus fruits, milk, eggs, peanuts, tree nuts, or shellfish. Are allergic to: Medicines. Latex. Insects. Animals. Pollen. What are the signs or symptoms? Common symptoms of this condition include raised, itchy, red or white bumps or patches on your skin. These areas may: Become large and swollen (welts). Change in  shape and location, quickly and repeatedly. Be separate hives or connect over a large area of skin. Sting or become painful. Turn white when pressed in the center (blanch). In severe cases, your hands, feet, and face may also become swollen. This mayoccur if hives develop deeper in your skin. How is this diagnosed? This condition may be diagnosed by your symptoms, medical history, and physical exam. Your skin, urine, or blood may be tested to find out what is causing your hives and to rule out other health issues. Your health care provider may also remove a small sample of skin from the affected area and examine it under a microscope (biopsy). How is this treated? Treatment for this condition depends on the cause and severity of your symptoms. Your health care provider may recommend using cool, wet cloths (cool compresses) or taking cool showers to relieve itching. Treatment may include: Medicines that help: Relieve itching (antihistamines). Reduce swelling (corticosteroids). Treat infection (antibiotics). An injectable medicine (omalizumab). Your health care provider may prescribe this if you have chronic idiopathic hives and you continue to have symptoms even after treatment with antihistamines. Severe cases may require an emergency injection of adrenaline (epinephrine) to prevent a life-threatening allergic reaction (anaphylaxis). Follow these instructions at home: Medicines Take and apply over-the-counter and prescription medicines only as told by your health care provider. If you were prescribed an antibiotic medicine, take it as told by your health care provider. Do not stop using the antibiotic even if you start to feel better. Skin care Apply cool compresses to the affected areas. Do not scratch or rub your skin. General instructions Do not take hot showers or baths.  This can make itching worse. Do not wear tight-fitting clothing. Use sunscreen and wear protective clothing when you  are outside. Avoid any substances that cause your hives. Keep a journal to help track what causes your hives. Write down: What medicines you take. What you eat and drink. What products you use on your skin. Keep all follow-up visits as told by your health care provider. This is important. Contact a health care provider if: Your symptoms are not controlled with medicine. Your joints are painful or swollen. Get help right away if: You have a fever. You have pain in your abdomen. Your tongue or lips are swollen. Your eyelids are swollen. Your chest or throat feels tight. You have trouble breathing or swallowing. These symptoms may represent a serious problem that is an emergency. Do not wait to see if the symptoms will go away. Get medical help right away. Call your local emergency services (911 in the [place] Do not drive yourself to the hospital. Summary Hives (urticaria) are itchy, red, swollen areas on your skin. Hives come from the body's reaction to something a person is allergic to (allergen), something that causes irritation, or various other triggers. Treatment for this condition depends on the cause and severity of your symptoms. Avoid any substances that cause your hives. Keep a journal to help track what causes your hives. Take and apply over-the-counter and prescription medicines only as told by your health care provider. Keep all follow-up visits as told by your health care provider. This is important. This information is not intended to replace advice given to you by your health care provider. Make sure you discuss any questions you have with your healthcare provider. Document Revised: 02/06/2018 Document Reviewed: 02/06/2018 Elsevier Patient Education  [place].

## 2021-01-24 NOTE — Progress Notes (Signed)
   Subjective:    Patient ID: [name], female    DOB: 09-08-1965, 55 y.o.   MRN: [id number]  HPI Chief Complaint  Patient presents with   other    Rash on chest and mid section and now is all over body.    Complains of a pruritic rash that started on her lower abdomen 5 days ago. It is now intermittent and appearing on her neck, chest, upper arms and hips at times.  Complains of severe itching.  No known trigger. Denies history of the same.  No allergies other than to shellfish and certain nuts. No exposure to either.   Denies any new medication, soaps, detergents, lotions, clothing, etc. Cannot think of anything she has done that is different.  No pets.    Taking oral Benadryl and using topical benadryl.    Denies fever, chills, dizziness, chest pain, palpitations, shortness of breath, abdominal pain, N/V/D, urinary symptoms, LE edema.    Review of Systems Pertinent positives and negatives in the history of present illness.     Objective:   Physical Exam BP 124/82   Pulse 74   Temp 98 F (36.7 C)   Wt 226 lb 6.4 oz (102.7 kg)   LMP 03/17/2013   BMI 40.10 kg/m    Alert and in no distress. Respirations unlabored. Normal speech. Skin is warm and dry. Urticaria type rash on her right anterior neck.       Assessment & Plan:  Urticaria - Plan: levocetirizine (XYZAL) 5 MG tablet  Reviewed several pictures on her phone that shows a pruritic urticaria-like rash on different parts of her torso.  The rash is intermittent.  No known triggers but advised her to pay close attention to see if she notices anything causing her rash.  For now she will start on Xyzal every morning and may take Benadryl at bedtime if needed.  Discussed cool compresses, cool showers and baths to help with itching.  Discussed other treatment options such as Singulair but we will not add this today.  Consider it down the road if rash is not improving or worsening.  Discussed the possibility  of hydroxyzine but she works and cannot be sedated during the day. No referral to allergy at this time is warranted.

## 2021-01-28 ENCOUNTER — Other Ambulatory Visit (HOSPITAL_COMMUNITY): Payer: Self-pay

## 2021-01-28 ENCOUNTER — Ambulatory Visit: Payer: BC Managed Care – PPO | Admitting: Internal Medicine

## 2021-01-28 ENCOUNTER — Encounter: Payer: Self-pay | Admitting: Family Medicine

## 2021-01-28 ENCOUNTER — Telehealth: Payer: Self-pay

## 2021-01-28 ENCOUNTER — Other Ambulatory Visit: Payer: Self-pay

## 2021-01-28 MED ORDER — MONTELUKAST SODIUM 10 MG PO TABS
10.0000 mg | ORAL_TABLET | Freq: Every day | ORAL | 1 refills | Status: DC
  Filled 2021-01-28: qty 30, 30d supply, fill #0

## 2021-01-28 NOTE — Telephone Encounter (Signed)
Pt. Called wanting to know if her Singulair had been sent in yet. I told her that you had already left out of the office but per your note pt. Could try Singulair so [facility] sent in prescription for Singulair.

## 2021-01-31 ENCOUNTER — Encounter: Payer: Self-pay | Admitting: Family Medicine

## 2021-01-31 ENCOUNTER — Ambulatory Visit: Payer: BC Managed Care – PPO | Admitting: Internal Medicine

## 2021-01-31 DIAGNOSIS — L509 Urticaria, unspecified: Secondary | ICD-10-CM

## 2021-02-02 ENCOUNTER — Other Ambulatory Visit: Payer: Self-pay

## 2021-02-02 ENCOUNTER — Other Ambulatory Visit: Payer: BC Managed Care – PPO

## 2021-02-02 DIAGNOSIS — L509 Urticaria, unspecified: Secondary | ICD-10-CM

## 2021-02-03 ENCOUNTER — Encounter: Payer: Self-pay | Admitting: Family Medicine

## 2021-02-03 LAB — CBC WITH DIFFERENTIAL/PLATELET
Basophils Absolute: 0 10*3/uL (ref 0.0–0.2)
Basos: 1 %
EOS (ABSOLUTE): 0.2 10*3/uL (ref 0.0–0.4)
Eos: 2 %
Hematocrit: 40.9 % (ref 34.0–46.6)
Hemoglobin: 13.7 g/dL (ref 11.1–15.9)
Immature Grans (Abs): 0 10*3/uL (ref 0.0–0.1)
Immature Granulocytes: 0 %
Lymphocytes Absolute: 3 10*3/uL (ref 0.7–3.1)
Lymphs: 37 %
MCH: 27.8 pg (ref 26.6–33.0)
MCHC: 33.5 g/dL (ref 31.5–35.7)
MCV: 83 fL (ref 79–97)
Monocytes Absolute: 0.7 10*3/uL (ref 0.1–0.9)
Monocytes: 9 %
Neutrophils Absolute: 4.2 10*3/uL (ref 1.4–7.0)
Neutrophils: 51 %
Platelets: 393 10*3/uL (ref 150–450)
RBC: 4.93 x10E6/uL (ref 3.77–5.28)
RDW: 13.2 % (ref 11.7–15.4)
WBC: 8.1 10*3/uL (ref 3.4–10.8)

## 2021-02-03 LAB — COMPREHENSIVE METABOLIC PANEL
ALT: 13 IU/L (ref 0–32)
AST: 14 IU/L (ref 0–40)
Albumin/Globulin Ratio: 1.7 (ref 1.2–2.2)
Albumin: 4.5 g/dL (ref 3.8–4.9)
Alkaline Phosphatase: 56 IU/L (ref 44–121)
BUN/Creatinine Ratio: 16 (ref 9–23)
BUN: 12 mg/dL (ref 6–24)
Bilirubin Total: 0.3 mg/dL (ref 0.0–1.2)
CO2: 24 mmol/L (ref 20–29)
Calcium: 10.3 mg/dL — ABNORMAL HIGH (ref 8.7–10.2)
Chloride: 99 mmol/L (ref 96–106)
Creatinine, Ser: 0.73 mg/dL (ref 0.57–1.00)
Globulin, Total: 2.7 g/dL (ref 1.5–4.5)
Glucose: 90 mg/dL (ref 65–99)
Potassium: 4.8 mmol/L (ref 3.5–5.2)
Sodium: 137 mmol/L (ref 134–144)
Total Protein: 7.2 g/dL (ref 6.0–8.5)
eGFR: 97 mL/min/{1.73_m2} (ref >59)

## 2021-02-03 LAB — IRON,TIBC AND FERRITIN PANEL
Ferritin: 143 ng/mL (ref 15–150)
Iron Saturation: 21 % (ref 15–55)
Iron: 65 ug/dL (ref 27–159)
Total Iron Binding Capacity: 310 ug/dL (ref 250–450)
UIBC: 245 ug/dL (ref 131–425)

## 2021-02-03 LAB — SEDIMENTATION RATE: Sed Rate: 9 mm/hr (ref 0–40)

## 2021-02-06 ENCOUNTER — Encounter: Payer: Self-pay | Admitting: Family Medicine

## 2021-02-11 ENCOUNTER — Other Ambulatory Visit: Payer: Self-pay

## 2021-02-11 ENCOUNTER — Ambulatory Visit
Admission: RE | Admit: 2021-02-11 | Discharge: 2021-02-11 | Disposition: A | Discharge status: Home / Self Care | Payer: 59 | Source: Ambulatory Visit | Attending: Family Medicine | Admitting: Family Medicine

## 2021-02-11 DIAGNOSIS — Z1231 Encounter for screening mammogram for malignant neoplasm of breast: Secondary | ICD-10-CM

## 2021-02-28 ENCOUNTER — Encounter: Payer: Self-pay | Admitting: Family Medicine

## 2021-03-01 ENCOUNTER — Encounter: Payer: Self-pay | Admitting: Family Medicine

## 2021-03-01 ENCOUNTER — Other Ambulatory Visit (HOSPITAL_COMMUNITY): Payer: Self-pay

## 2021-03-01 ENCOUNTER — Other Ambulatory Visit: Payer: Self-pay | Admitting: Medical

## 2021-03-01 DIAGNOSIS — L509 Urticaria, unspecified: Secondary | ICD-10-CM

## 2021-03-01 MED ORDER — MONTELUKAST SODIUM 10 MG PO TABS
10.0000 mg | ORAL_TABLET | Freq: Every day | ORAL | 1 refills | Status: DC
  Filled 2021-03-01: qty 30, 30d supply, fill #0

## 2021-03-01 MED ORDER — PREDNISONE 20 MG PO TABS
20.0000 mg | ORAL_TABLET | Freq: Every day | ORAL | 0 refills | Status: DC
  Filled 2021-03-01: qty 5, 5d supply, fill #0

## 2021-03-01 NOTE — Progress Notes (Signed)
urticaria

## 2021-03-02 ENCOUNTER — Other Ambulatory Visit: Payer: Self-pay | Admitting: Medical

## 2021-03-02 ENCOUNTER — Telehealth: Payer: Self-pay | Admitting: Medical

## 2021-03-02 DIAGNOSIS — L509 Urticaria, unspecified: Secondary | ICD-10-CM

## 2021-03-02 DIAGNOSIS — R21 Rash and other nonspecific skin eruption: Secondary | ICD-10-CM

## 2021-03-02 NOTE — Telephone Encounter (Signed)
Pt called and wanted to see if you felt like it would be necessary for her to have the dermatology referral if she is going to be seen by an allergy specialist.

## 2021-03-02 NOTE — Telephone Encounter (Signed)
Pt said she would rather have a allergy referral instead because Dermatology cant get her in until Jan.

## 2021-03-02 NOTE — Telephone Encounter (Signed)
Derm referral was cancel by pt and referral to allergy has been put in. [facility]

## 2021-03-02 NOTE — Telephone Encounter (Signed)
Pt advised.

## 2021-03-07 ENCOUNTER — Encounter: Payer: Self-pay | Admitting: Family Medicine

## 2021-03-08 NOTE — Telephone Encounter (Signed)
Left Vm for pt to call the office and schedule

## 2021-03-11 ENCOUNTER — Other Ambulatory Visit (HOSPITAL_COMMUNITY): Payer: Self-pay

## 2021-03-11 ENCOUNTER — Ambulatory Visit: Payer: 59 | Admitting: Medical

## 2021-03-11 ENCOUNTER — Other Ambulatory Visit: Payer: Self-pay

## 2021-03-11 VITALS — BP 120/70 | HR 68 | Wt 227.2 lb

## 2021-03-11 DIAGNOSIS — L509 Urticaria, unspecified: Secondary | ICD-10-CM

## 2021-03-11 MED ORDER — HYDROXYZINE HCL 10 MG PO TABS
10.0000 mg | ORAL_TABLET | Freq: Two times a day (BID) | ORAL | 1 refills | Status: DC
  Filled 2021-03-11: qty 45, 23d supply, fill #0
  Filled 2021-04-18: qty 45, 23d supply, fill #1

## 2021-03-11 NOTE — Progress Notes (Signed)
Subjective:  [name] is a 55 y.o. female who presents for Chief Complaint  Patient presents with   itching    Itching all over . Sees allergist in October. Been itching for the last months     Here for ongoing problem with hives.  The problem started about a month and a half ago.  She has had intermittent flareup of hives and itching.  Often the hives will be on her abdomen or under her breast but they can be on the face on the arms and other areas.  Some days she does not have any some days its a lot.  More often it is at home when she gets home from work but sometimes it can be at work with more itching than the hives.  She wonders about dust mites.  She is not sure what she could be allergic to.  She has not really changed anything with her hygiene products or eating habits.  She uses hypoallergenic soaps and detergent  She is a non-smoker.  No animals in the house.  She does have some carpet in the house.  And there is some carpet at work.  No other aggravating or relieving factors.    No other c/o.  The following portions of the patient's history were reviewed and updated as appropriate: allergies, current medications, past family history, past medical history, past social history, past surgical history and problem list.  ROS Otherwise as in subjective above  Objective: BP 120/70   Pulse 68   Wt 227 lb 3.2 oz (103.1 kg)   LMP 03/17/2013   BMI 40.25 kg/m   General appearance: alert, no distress, well developed, well nourished Skin unremarkable today but she shows me pictures on her phone that include numerous pictures of urticarial have lesions mostly on the abdomen but some on her face, some on her neck, some on her arms and legs   Assessment: Encounter Diagnosis  Name Primary?   Hives Yes     Plan: We discussed hives and urticaria and possible triggers.  We discussed that sometimes we never find a trigger such as idiopathic urticaria.  I advise she start  keeping a diary of things that she is around or things that she eats to try to narrow down a trigger.  The symptoms do tend to be worse when she is at home so presumably it could be something at home she is exposed to.  Since a lot of the hives are on the abdomen around the breast she is going to try wearing some different bra material.  Continue Singulair but begin hydroxyzine as below.  Consider adding famotidine Pepcid.  She declined this today.  She just completed a course of prednisone.  We discussed trying to limit prednisone except for worse flareups  We discussed the possibility of a medication such as her olmesartan being a trigger.  We can consider swapping this out for something else going forward but for now she will continue her current medications  [name] was seen today for itching.  Diagnoses and all orders for this visit:  Hives  Other orders -     hydrOXYzine (ATARAX/VISTARIL) 10 MG tablet; Take 1 tablet (10 mg total) by mouth in the morning and at bedtime.    Follow up: pending allergist

## 2021-03-11 NOTE — Patient Instructions (Signed)
Continue Singulair Begin Hydroxyzine '10mg'$  twice daily, or up to three times daily Consider using a daily moisturizing lotion such as Cetaphil, Aquaphor or Lubriderm   Start keeping a journal to try and determine trigger

## 2021-03-15 ENCOUNTER — Encounter: Payer: Self-pay | Admitting: Family Medicine

## 2021-03-17 ENCOUNTER — Telehealth: Payer: Self-pay

## 2021-03-17 NOTE — Telephone Encounter (Signed)
Pt. Called stating she sent a mychart message on 03/15/21 and did not hear back from it yet. She just wanted to make sure it was sent to you.   Good afternoon, I haven't had any issues with the hives since taking the medication that was prescribed.  Thank you   I've been having some numbness and tingling in my hands, feet and legs.     Please advise   THank you

## 2021-04-08 ENCOUNTER — Other Ambulatory Visit (HOSPITAL_COMMUNITY): Payer: Self-pay

## 2021-04-18 ENCOUNTER — Other Ambulatory Visit (HOSPITAL_COMMUNITY): Payer: Self-pay

## 2021-05-04 ENCOUNTER — Encounter: Payer: Self-pay | Admitting: Family Medicine

## 2021-05-06 ENCOUNTER — Other Ambulatory Visit: Payer: 59

## 2021-05-10 DIAGNOSIS — H43811 Vitreous degeneration, right eye: Secondary | ICD-10-CM | POA: Diagnosis not present

## 2021-05-10 DIAGNOSIS — D492 Neoplasm of unspecified behavior of bone, soft tissue, and skin: Secondary | ICD-10-CM | POA: Diagnosis not present

## 2021-05-13 ENCOUNTER — Other Ambulatory Visit: Payer: 59

## 2021-05-13 ENCOUNTER — Ambulatory Visit: Payer: BC Managed Care – PPO | Admitting: Allergy

## 2021-05-18 NOTE — Progress Notes (Signed)
   Subjective:  Documentation for virtual audio and video telecommunications through [place] encounter:  The patient was located at home. 2 patient identifiers used.  The provider was located in the office. The patient did consent to this visit and is aware of possible charges through their insurance for this visit.  The other persons participating in this telemedicine service were none. Time spent on call was 15 minutes and in review of previous records >20 minutes total.  This virtual service is not related to other E/M service within previous 7 days.   Patient ID: [name], female    DOB: Sep 12, 1965, 55 y.o.   MRN: [id number]  HPI Chief Complaint  Patient presents with   Cough    Dry cough, viral states she gets every year, sinus drainage, wheezing, abnormal SHOB. Bilateral ear pain. S/S started Saturday   Complains of a 6 day history of sinus pain and pressure, sore throat, ear pain, headache, dry cough, wheezing, shortness of breath.   She has been using Sudafed, Mucinex, albuterol.  Has not been on her Singulair recently.  States she gets a similar infection every year that required steroids and antibiotics.   Denies fever, chills, dizziness, chest pain, palpitations, abdominal pain, N/V/D, urinary symptoms, LE edema.   Reviewed allergies, medications, past medical, surgical, family, and social history.    Review of Systems Pertinent positives and negatives in the history of present illness.     Objective:   Physical Exam Temp (!) 97.5 F (36.4 C) (Oral)   Wt 220 lb (99.8 kg)   LMP 03/17/2013   BMI 38.97 kg/m   Alert and oriented in no acute distress.  Respirations unlabored.  She does sound nasally congested.  Dry cough noted.      Assessment & Plan:  Acute non-recurrent pansinusitis  Acute cough  Wheezing  Shortness of breath  No acute distress.  She was able to find a pulse oximeter and her oxygen level is 96-97% Amoxicillin,  prednisone Dosepak, Tessalon Perles prescribed.  We also discussed symptomatic management including Mucinex, Flonase, Tylenol or ibuprofen and starting back on her Singulair.  She will continue using her albuterol.  Good hydration. I prescribed Diflucan for her per her request due to yeast infections with antibiotics. She will follow-up if worsening or not back to baseline when she completes the medication

## 2021-05-19 ENCOUNTER — Other Ambulatory Visit: Payer: Self-pay

## 2021-05-19 ENCOUNTER — Encounter: Payer: Self-pay | Admitting: Family Medicine

## 2021-05-19 ENCOUNTER — Other Ambulatory Visit (HOSPITAL_COMMUNITY): Payer: Self-pay

## 2021-05-19 ENCOUNTER — Telehealth: Payer: 59 | Admitting: Family Medicine

## 2021-05-19 VITALS — Temp 97.5°F | Wt 220.0 lb

## 2021-05-19 DIAGNOSIS — R062 Wheezing: Secondary | ICD-10-CM | POA: Diagnosis not present

## 2021-05-19 DIAGNOSIS — R0602 Shortness of breath: Secondary | ICD-10-CM | POA: Diagnosis not present

## 2021-05-19 DIAGNOSIS — R051 Acute cough: Secondary | ICD-10-CM

## 2021-05-19 DIAGNOSIS — J0140 Acute pansinusitis, unspecified: Secondary | ICD-10-CM | POA: Diagnosis not present

## 2021-05-19 MED ORDER — BENZONATATE 200 MG PO CAPS
200.0000 mg | ORAL_CAPSULE | Freq: Two times a day (BID) | ORAL | 0 refills | Status: DC | PRN
  Filled 2021-05-19: qty 20, 10d supply, fill #0

## 2021-05-19 MED ORDER — AMOXICILLIN 875 MG PO TABS
875.0000 mg | ORAL_TABLET | Freq: Two times a day (BID) | ORAL | 0 refills | Status: AC
  Filled 2021-05-19: qty 20, 10d supply, fill #0

## 2021-05-19 MED ORDER — FLUCONAZOLE 150 MG PO TABS
150.0000 mg | ORAL_TABLET | Freq: Once | ORAL | 0 refills | Status: AC
  Filled 2021-05-19: qty 1, 1d supply, fill #0

## 2021-05-19 MED ORDER — PREDNISONE 10 MG (21) PO TBPK
ORAL_TABLET | Freq: Every day | ORAL | 0 refills | Status: DC
  Filled 2021-05-19: qty 21, 6d supply, fill #0

## 2021-05-31 ENCOUNTER — Other Ambulatory Visit: Payer: Self-pay | Admitting: Medical

## 2021-05-31 ENCOUNTER — Other Ambulatory Visit (HOSPITAL_COMMUNITY): Payer: Self-pay

## 2021-05-31 DIAGNOSIS — J4520 Mild intermittent asthma, uncomplicated: Secondary | ICD-10-CM

## 2021-05-31 MED ORDER — LEVALBUTEROL TARTRATE 45 MCG/ACT IN AERO
1.0000 | INHALATION_SPRAY | RESPIRATORY_TRACT | 2 refills | Status: DC | PRN
  Filled 2021-05-31: qty 15, 33d supply, fill #0

## 2021-05-31 MED ORDER — PROAIR RESPICLICK 108 (90 BASE) MCG/ACT IN AEPB
2.0000 | INHALATION_SPRAY | RESPIRATORY_TRACT | 1 refills | Status: DC | PRN
Start: 2021-05-31 — End: 2021-05-31
  Filled 2021-05-31: qty 1, 17d supply, fill #0

## 2021-06-01 ENCOUNTER — Other Ambulatory Visit (HOSPITAL_COMMUNITY): Payer: Self-pay

## 2021-06-02 ENCOUNTER — Other Ambulatory Visit: Payer: Self-pay | Admitting: Medical

## 2021-06-02 ENCOUNTER — Other Ambulatory Visit (HOSPITAL_COMMUNITY): Payer: Self-pay

## 2021-06-02 MED ORDER — ALBUTEROL SULFATE HFA 108 (90 BASE) MCG/ACT IN AERS
2.0000 | INHALATION_SPRAY | Freq: Four times a day (QID) | RESPIRATORY_TRACT | 0 refills | Status: DC | PRN
  Filled 2021-06-02: qty 8.5, 25d supply, fill #0

## 2021-06-02 MED ORDER — BENZONATATE 200 MG PO CAPS
200.0000 mg | ORAL_CAPSULE | Freq: Two times a day (BID) | ORAL | 0 refills | Status: DC | PRN
  Filled 2021-06-02: qty 20, 10d supply, fill #0

## 2021-06-03 ENCOUNTER — Other Ambulatory Visit (HOSPITAL_COMMUNITY): Payer: Self-pay

## 2021-06-08 ENCOUNTER — Other Ambulatory Visit: Payer: Self-pay

## 2021-06-08 ENCOUNTER — Other Ambulatory Visit (HOSPITAL_COMMUNITY): Payer: Self-pay

## 2021-06-08 ENCOUNTER — Ambulatory Visit (INDEPENDENT_AMBULATORY_CARE_PROVIDER_SITE_OTHER): Payer: 59 | Admitting: *Deleted

## 2021-06-08 VITALS — BP 125/77 | HR 92

## 2021-06-08 DIAGNOSIS — R300 Dysuria: Secondary | ICD-10-CM | POA: Diagnosis not present

## 2021-06-08 LAB — POCT URINALYSIS DIPSTICK

## 2021-06-08 MED ORDER — PHENAZOPYRIDINE HCL 200 MG PO TABS
200.0000 mg | ORAL_TABLET | Freq: Three times a day (TID) | ORAL | 1 refills | Status: DC | PRN
  Filled 2021-06-08: qty 10, 4d supply, fill #0

## 2021-06-08 MED ORDER — NITROFURANTOIN MONOHYD MACRO 100 MG PO CAPS
100.0000 mg | ORAL_CAPSULE | Freq: Two times a day (BID) | ORAL | 0 refills | Status: DC
  Filled 2021-06-08: qty 10, 5d supply, fill #0

## 2021-06-08 NOTE — Progress Notes (Signed)
SUBJECTIVE: [name] is a 55 y.o. female who complains of urinary frequency, urgency and dysuria x 3 days, without flank pain, fever, chills, or abnormal vaginal discharge or bleeding.   OBJECTIVE: Appears well, in no apparent distress.  Vital signs are normal. Urine dipstick shows positive for RBC's and positive for leukocytes.    ASSESSMENT: Dysuria  PLAN: Will send urine culture.Treatment per orders.  Call or return to clinic prn if these symptoms worsen or fail to improve as anticipated.

## 2021-06-10 NOTE — Progress Notes (Signed)
Attestation of Attending Supervision of clinical support staff: I agree with the care provided to this patient and was available for any consultation.  I have reviewed the RN's note and chart. I was available for consult and to see the patient if needed.   Prisha Hiley MD MPH Attending Physician Faculty Practice- [facility]  

## 2021-06-15 ENCOUNTER — Telehealth: Payer: Self-pay | Admitting: Family Medicine

## 2021-06-15 LAB — URINE CULTURE

## 2021-06-15 NOTE — Telephone Encounter (Signed)
Pt called states her dentist office dropped the ball on getting her mouth guard. Pt wanting to know if she can do the nasal pillow with the CPAP machine instead. Pt requesting a call back or she states a response through Mychart will be just fine.

## 2021-06-15 NOTE — Telephone Encounter (Signed)
Scheduled patient's appt 09/22/20 at 2:30 p with NP, Amy Lomax.

## 2021-06-20 DIAGNOSIS — N281 Cyst of kidney, acquired: Secondary | ICD-10-CM | POA: Diagnosis not present

## 2021-06-23 ENCOUNTER — Encounter: Payer: Self-pay | Admitting: Obstetrics & Gynecology

## 2021-07-05 ENCOUNTER — Other Ambulatory Visit: Payer: Self-pay | Admitting: Medical

## 2021-07-05 ENCOUNTER — Other Ambulatory Visit (HOSPITAL_COMMUNITY): Payer: Self-pay

## 2021-07-05 MED ORDER — SPIRONOLACTONE 25 MG PO TABS
25.0000 mg | ORAL_TABLET | Freq: Every day | ORAL | 1 refills | Status: DC
  Filled 2021-07-05: qty 90, 90d supply, fill #0
  Filled 2021-10-06: qty 90, 90d supply, fill #1

## 2021-07-05 MED ORDER — OLMESARTAN MEDOXOMIL 20 MG PO TABS
20.0000 mg | ORAL_TABLET | Freq: Every day | ORAL | 1 refills | Status: DC
  Filled 2021-07-05: qty 90, 90d supply, fill #0
  Filled 2021-10-06: qty 90, 90d supply, fill #1

## 2021-07-05 MED ORDER — HYDROXYZINE HCL 10 MG PO TABS
10.0000 mg | ORAL_TABLET | Freq: Two times a day (BID) | ORAL | 1 refills | Status: DC
  Filled 2021-07-05: qty 45, 23d supply, fill #0
  Filled 2021-08-08: qty 45, 23d supply, fill #1

## 2021-07-11 ENCOUNTER — Encounter: Payer: BC Managed Care – PPO | Admitting: Family Medicine

## 2021-07-20 DIAGNOSIS — G4733 Obstructive sleep apnea (adult) (pediatric): Secondary | ICD-10-CM | POA: Diagnosis not present

## 2021-07-20 DIAGNOSIS — L509 Urticaria, unspecified: Secondary | ICD-10-CM | POA: Diagnosis not present

## 2021-07-20 DIAGNOSIS — E038 Other specified hypothyroidism: Secondary | ICD-10-CM | POA: Diagnosis not present

## 2021-07-20 DIAGNOSIS — Z6841 Body Mass Index (BMI) 40.0 and over, adult: Secondary | ICD-10-CM | POA: Diagnosis not present

## 2021-07-20 DIAGNOSIS — E559 Vitamin D deficiency, unspecified: Secondary | ICD-10-CM | POA: Diagnosis not present

## 2021-07-20 DIAGNOSIS — M791 Myalgia, unspecified site: Secondary | ICD-10-CM | POA: Diagnosis not present

## 2021-07-20 DIAGNOSIS — J4520 Mild intermittent asthma, uncomplicated: Secondary | ICD-10-CM | POA: Diagnosis not present

## 2021-07-20 DIAGNOSIS — I10 Essential (primary) hypertension: Secondary | ICD-10-CM | POA: Diagnosis not present

## 2021-07-20 DIAGNOSIS — M2559 Pain in other specified joint: Secondary | ICD-10-CM | POA: Diagnosis not present

## 2021-07-28 ENCOUNTER — Ambulatory Visit: Payer: 59 | Admitting: Obstetrics & Gynecology

## 2021-08-02 ENCOUNTER — Encounter: Payer: Self-pay | Admitting: Radiology

## 2021-08-08 ENCOUNTER — Other Ambulatory Visit (HOSPITAL_COMMUNITY): Payer: Self-pay

## 2021-08-11 ENCOUNTER — Other Ambulatory Visit (HOSPITAL_COMMUNITY): Payer: Self-pay

## 2021-08-11 DIAGNOSIS — R051 Acute cough: Secondary | ICD-10-CM | POA: Diagnosis not present

## 2021-08-11 DIAGNOSIS — H9203 Otalgia, bilateral: Secondary | ICD-10-CM | POA: Diagnosis not present

## 2021-08-11 DIAGNOSIS — R519 Headache, unspecified: Secondary | ICD-10-CM | POA: Diagnosis not present

## 2021-08-11 DIAGNOSIS — J029 Acute pharyngitis, unspecified: Secondary | ICD-10-CM | POA: Diagnosis not present

## 2021-08-11 MED ORDER — PREDNISOLONE SODIUM PHOSPHATE 15 MG/5ML PO SOLN
OROMUCOSAL | 0 refills | Status: DC
  Filled 2021-08-11: qty 120, 14d supply, fill #0

## 2021-09-22 ENCOUNTER — Ambulatory Visit: Payer: 59 | Admitting: Family Medicine

## 2021-09-22 ENCOUNTER — Encounter: Payer: Self-pay | Admitting: Family Medicine

## 2021-09-22 VITALS — BP 119/77 | HR 72 | Ht 63.0 in | Wt 228.0 lb

## 2021-09-22 DIAGNOSIS — G4733 Obstructive sleep apnea (adult) (pediatric): Secondary | ICD-10-CM

## 2021-09-22 NOTE — Patient Instructions (Signed)
Untreated obstructive sleep apnea when it is moderate to severe can have an adverse impact on cardiovascular health and raise her risk for heart disease, arrhythmias, hypertension, congestive heart failure, stroke and diabetes. Untreated obstructive sleep apnea causes sleep disruption, nonrestorative sleep, and sleep deprivation. This can have an impact on your day to day functioning and cause daytime sleepiness and impairment of cognitive function, memory loss, mood disturbance, and problems focussing.  Follow up with dentistry.

## 2021-09-22 NOTE — Progress Notes (Signed)
PATIENT: [name] DOB: 1966/08/07  REASON FOR VISIT: follow up HISTORY FROM: patient  Chief Complaint  Patient presents with   Follow-up    Pt alone, rm 16 she has tried and failed CPAP and was sent for a dental device. They attempted to completed the work up and there was a lack of communication with her dentist that she went through. She would like to see about referral to who we use or if there is alternative options     HISTORY OF PRESENT ILLNESS: 09/22/21 ALL: [name] returns for follow up for OSA. She was previously using CPAP but had decided she did not want to continue therapy at last visit 05/2020. She tried working with dentist for oral appliance but she has had difficulty with her dentist in getting oral appliance. Insurance initially approved but when she went for cleaning she was told it was denied. She reports having difficulty with communication and requesting a referral to a different dentist that can assist her with getting oral appliance. She is working on [employer]. She does yoga and line dancing every week. She tries to walk regularly.   06/02/2020 ALL:  [name] is a 56 y.o. female here today for follow up for OSA. She was last seen 03/2019 and doing fairly well on CPAP. She reports that she stopped using therapy. She feels that she is not resting when using CPAP. She gets very anxious and feels claustrophobic when using therapy. She denies significant anxiety outside of using CPAP. She has reached out to DME for support but just does not feel she can use CPAP. She is open to considering dental device. Not interested in [facility]. She is working on healthy lifestyle habits. She is not having headaches.   HISTORY: (copied from my note on 03/13/2019)  [name] is a 56 y.o. female here today for follow up for mild OSA on CPAP.  She feels that she is doing fairly well with CPAP therapy at home.  She does endorse some  anxiety in the beginning.  She reached out to her sleep care and feels that this has been beneficial.  Compliance report dated 02/10/2019 through 03/11/2019 reveals that she has used CPAP 27 out of the last 30 days for compliance of 90%.  26 days she used her CPAP machine greater than 4 hours for compliance of 87%.  AHI was 0.9 on 6 to 16 cm of water and EPR of 3.  There was no significant leak.  She reports that she feels energized and has no concerns today.    HISTORY: (copied from Dr Dohmeier's note on 12/03/2018)   Chief complaint according to patient : " my husband says I snore, my mother confirmed that", and " I have a new form of headache in the morning". 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  The environment has not changed her sleep related morning headaches.   HPI:  [name] is a 56 y.o. female Patient and seen here in a Video -Consultation upon referral from Dr. Baird Cancer.  She reports of recurrent headache problem is the most recent episode starting in mid February.  She describes the quality of the pain as aching, dull and throbbing and not that  severe she rated it at 5 out of 10 , Dr. Baird Cancer encouraged her to try magnesium at night and she referred her because the patient also stated that she was snoring and she wants to make sure that this is not a case of obstructive sleep apnea related morning headaches.  The patient is in the class II obesity severity with a BMI between 36 and 37, and she is suffering from essential hypertension. In our telephone and video interview the patient stated that her headaches are migrainous they concentrate on the forehead they appear in the morning they are present when she wakes up but they do not wake her up from  sleep. She does not experience: Nausea, photophobia or phonophobia.   Sleep and medical history:  Asthma, morning headaches, migraine headaches. See above   Family medical-/ sleep history: The patient is unaware of a first-degree relative being diagnosed with sleep apnea.  She was not even sure if her parents ever be snored but her sister has complained about restless leg symptoms.   Social history: The patient is currently working from home  ( Covid )she is married and currently her son 92 is living with them she also has a Psychiatrist that has a new baby and lives in the same house.  She states she has multiple stepchildren and 18 step grandchildren.  She exercises 3 times weekly keeps a heart healthy diet with low sodium.  She drinks 1 cup of coffee in the morning she does not use sodas, she does not use alcohol and she does not use any form of tobacco.   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  However she is going to her bedroom at 930 where she is watching movies and TV.  Once she is asleep she stops snoring.  She is not quite sure how long she actually stays awake with the TV in the background but it may be 30 minutes.  She prefers to sleep on her sides she uses only one pillow for head support and she sleeps on a flat non-adjustable bed.  She has one bathroom break each night and she goes back to sleep usually promptly doing her normal work hours she has to rise at 6 AM and is woken by an alarm.  She usually feels rested and restored in the morning.  She does not endorse any restless legs the snoring has been more bothersome to her husband.   REVIEW OF SYSTEMS: Out of a complete 14 system review of symptoms, the patient complains only of the following symptoms, none and all other reviewed systems are  negative.  ALLERGIES: Allergies  Allergen Reactions   Peanut-Containing Drug Products Itching    Walnuts, pecans (tree nuts)   Shellfish Allergy Hives and Itching    HOME MEDICATIONS: Outpatient Medications Prior to Visit  Medication Sig Dispense Refill   Cyanocobalamin (B-12 PO) Take by mouth.     hydrOXYzine (ATARAX) 10 MG tablet Take 1 tablet (10 mg total) by mouth in the morning and at bedtime. 45 tablet 1   lidocaine-prednisoLONE-nystatin-alum & mag hydroxide-simeth-diphenhydrAMINE in distilled water Gargle 1-2 teaspoons every 4 hours as needed as directed 120 mL 0   olmesartan (BENICAR) 20 MG tablet Take 1 tablet (20 mg total) by mouth daily. 90 tablet 1  phenazopyridine (PYRIDIUM) 200 MG tablet Take 1 tablet (200 mg total) by mouth 3 (three) times daily as needed for pain (urethral spasm). 10 tablet 1   spironolactone (ALDACTONE) 25 MG tablet Take 1 tablet (25 mg total) by mouth daily. 90 tablet 1   Vitamin D, Cholecalciferol, 1000 units CAPS Take 1,000 mg by mouth daily.     No facility-administered medications prior to visit.    PAST MEDICAL HISTORY: Past Medical History:  Diagnosis Date   Asthma    no inhaler   Hypertension    PONV (postoperative nausea and vomiting) 2006    PAST SURGICAL HISTORY: Past Surgical History:  Procedure Laterality Date   BREAST BIOPSY Right 11/2018   benign   CESAREAN SECTION  1995   HYSTEROSCOPY N/A 12/13/2012   Procedure: HYSTEROSCOPY/IUD REMOVAL;  Surgeon: Marvene Staff, MD;  Location: [facility];  Service: Gynecology;  Laterality: N/A;   TUBAL LIGATION  2006    FAMILY HISTORY: Family History  Problem Relation Age of Onset   Hypertension Mother    Alcohol abuse Father     SOCIAL HISTORY: Social History   Socioeconomic History   Marital status: Married    Spouse name: Not on file   Number of children: Not on file   Years of education: Not on file   Highest education level: Not on file  Occupational History   Not on  file  Tobacco Use   Smoking status: Former    Packs/day: 0.50    Years: 33.00    Pack years: 16.50    Types: Cigarettes    Quit date: 05/04/2012    Years since quitting: 9.3   Smokeless tobacco: Never  Vaping Use   Vaping Use: Never used  Substance and Sexual Activity   Alcohol use: No   Drug use: No   Sexual activity: Yes    Birth control/protection: None  Other Topics Concern   Not on file  Social History Narrative   Not on file   Social Determinants of Health   Financial Resource Strain: Not on file  Food Insecurity: Not on file  Transportation Needs: Not on file  Physical Activity: Not on file  Stress: Not on file  Social Connections: Not on file  Intimate Partner Violence: Not on file      PHYSICAL EXAM  Vitals:   09/22/21 1354  BP: 119/77  Pulse: 72  Weight: 228 lb (103.4 kg)  Height: 5\' 3"  (1.6 m)    Body mass index is 40.39 kg/m.  Generalized: Well developed, in no acute distress  Cardiology: normal rate and rhythm, no murmur noted Respiratory: clear to auscultation bilaterally  Neurological examination  Mentation: Alert oriented to time, place, history taking. Follows all commands speech and language fluent Cranial nerve II-XII: Pupils were equal round reactive to light. Extraocular movements were full, visual field were full  Motor: The motor testing reveals 5 over 5 strength of all 4 extremities. Good symmetric motor tone is noted throughout.   Gait and station: Gait is normal.    DIAGNOSTIC DATA (LABS, IMAGING, TESTING) - I reviewed patient records, labs, notes, testing and imaging myself where available.  No flowsheet data found.   Lab Results  Component Value Date   WBC 8.1 02/02/2021   HGB 13.7 02/02/2021   HCT 40.9 02/02/2021   MCV 83 02/02/2021   PLT 393 02/02/2021      Component Value Date/Time   NA 137 02/02/2021 1218   K 4.8 02/02/2021 1218  CL 99 02/02/2021 1218   CO2 24 02/02/2021 1218   GLUCOSE 90 02/02/2021 1218    GLUCOSE 107 (H) 10/18/2020 0930   BUN 12 02/02/2021 1218   CREATININE 0.73 02/02/2021 1218   CALCIUM 10.3 (H) 02/02/2021 1218   PROT 7.2 02/02/2021 1218   ALBUMIN 4.5 02/02/2021 1218   AST 14 02/02/2021 1218   ALT 13 02/02/2021 1218   ALKPHOS 56 02/02/2021 1218   BILITOT 0.3 02/02/2021 1218   GFRNONAA 84 05/03/2020 1437   GFRAA 97 05/03/2020 1437   Lab Results  Component Value Date   CHOL 198 07/06/2020   HDL 43 07/06/2020   LDLCALC 139 (H) 07/06/2020   TRIG 86 07/06/2020   CHOLHDL 4.6 (H) 07/06/2020   Lab Results  Component Value Date   HGBA1C 6.2 (H) 05/03/2020   Lab Results  Component Value Date   VITAMINB12 1,144 05/03/2020   Lab Results  Component Value Date   TSH 0.53 10/18/2020     ASSESSMENT AND PLAN 56 y.o. year old female  has a past medical history of Asthma, Hypertension, and PONV (postoperative nausea and vomiting) (2006). here with     ICD-10-CM   1. OSA (obstructive sleep apnea)  G47.33 Ambulatory referral to Dentistry        Adriyanna reports significant anxiety and feelings of claustrophobia with CPAP usage.She does not wish to resume. We have reviewed her HST. I have educated her on risks of untreated sleep apnea and possible alternative treatment options. I do not feel she would qualify for [facility] due to BMI. She does not seem interested even with weight loss. She has tried getting an oral appliance with her family dentist but prefers someone with more experience in oral appliances. I will refer her to Dr Toy Cookey or Ron Parker based on availability and coverage. She was encouraged to continue healthy lifestyle habits. She will follow up with me pending decision to continue CPAP. She verbalizes understanding and agreement with this plan.   Orders Placed This Encounter  Procedures   Ambulatory referral to Dentistry    Referral Priority:   Routine    Referral Type:   Consultation    Referral Reason:   Specialty Services Required    Requested Specialty:    Dental General Practice    Number of Visits Requested:   1     No orders of the defined types were placed in this encounter.     Debbora Presto, FNP-C 09/22/2021, 2:23 PM [facility] [address], [place], [place] [address] [phone]

## 2021-09-26 ENCOUNTER — Telehealth: Payer: Self-pay | Admitting: Family Medicine

## 2021-09-26 NOTE — Telephone Encounter (Signed)
Sent to Dr. Ron Parker ph # [phone]

## 2021-09-27 ENCOUNTER — Other Ambulatory Visit: Payer: Self-pay

## 2021-09-27 ENCOUNTER — Ambulatory Visit (INDEPENDENT_AMBULATORY_CARE_PROVIDER_SITE_OTHER): Payer: 59 | Admitting: Obstetrics & Gynecology

## 2021-09-27 ENCOUNTER — Encounter: Payer: Self-pay | Admitting: Obstetrics & Gynecology

## 2021-09-27 ENCOUNTER — Other Ambulatory Visit (HOSPITAL_COMMUNITY)
Admission: RE | Admit: 2021-09-27 | Discharge: 2021-09-27 | Disposition: A | Discharge status: Home / Self Care | Payer: 59 | Source: Ambulatory Visit | Attending: Obstetrics & Gynecology | Admitting: Obstetrics & Gynecology

## 2021-09-27 VITALS — BP 117/78 | HR 80 | Wt 228.0 lb

## 2021-09-27 DIAGNOSIS — Z01419 Encounter for gynecological examination (general) (routine) without abnormal findings: Secondary | ICD-10-CM

## 2021-09-27 NOTE — Progress Notes (Signed)
GYNECOLOGY ANNUAL PREVENTATIVE CARE ENCOUNTER NOTE  History:     [name] is a 56 y.o. G53P1 PMP female here for a routine annual gynecologic exam.  Current complaints: none.   Denies abnormal vaginal bleeding, discharge, pelvic pain, problems with intercourse or other gynecologic concerns.    Gynecologic History Patient's last menstrual period was 03/17/2013. Contraception: post menopausal status Last Pap: 06/04/2019. Result was normal with negative HPV Last Mammogram: 02/11/2021.  Result was normal Last Colonoscopy: 03/17/2016.  Result was benign  Obstetric History OB History  Gravida Para Term Preterm AB Living  1         1  SAB IAB Ectopic Multiple Live Births          1    # Outcome Date GA Lbr Len/2nd Weight Sex Delivery Anes PTL Lv  1 Gravida             Past Medical History:  Diagnosis Date   Asthma    no inhaler   Hypertension    PONV (postoperative nausea and vomiting) 2006    Past Surgical History:  Procedure Laterality Date   BREAST BIOPSY Right 11/2018   benign   CESAREAN SECTION  1995   HYSTEROSCOPY N/A 12/13/2012   Procedure: HYSTEROSCOPY/IUD REMOVAL;  Surgeon: Marvene Staff, MD;  Location: [facility];  Service: Gynecology;  Laterality: N/A;   TUBAL LIGATION  2006    Current Outpatient Medications on File Prior to Visit  Medication Sig Dispense Refill   Cyanocobalamin (B-12 PO) Take by mouth.     hydrOXYzine (ATARAX) 10 MG tablet Take 1 tablet (10 mg total) by mouth in the morning and at bedtime. 45 tablet 1   olmesartan (BENICAR) 20 MG tablet Take 1 tablet (20 mg total) by mouth daily. 90 tablet 1   spironolactone (ALDACTONE) 25 MG tablet Take 1 tablet (25 mg total) by mouth daily. 90 tablet 1   Vitamin D, Cholecalciferol, 1000 units CAPS Take 1,000 mg by mouth daily.     lidocaine-prednisoLONE-nystatin-alum & mag hydroxide-simeth-diphenhydrAMINE in distilled water Gargle 1-2 teaspoons every 4 hours as needed as directed (Patient not  taking: Reported on 09/27/2021) 120 mL 0   phenazopyridine (PYRIDIUM) 200 MG tablet Take 1 tablet (200 mg total) by mouth 3 (three) times daily as needed for pain (urethral spasm). (Patient not taking: Reported on 09/27/2021) 10 tablet 1   No current facility-administered medications on file prior to visit.    Allergies  Allergen Reactions   Peanut-Containing Drug Products Itching    Walnuts, pecans (tree nuts)   Shellfish Allergy Hives and Itching    Social History:  reports that she quit smoking about 9 years ago. Her smoking use included cigarettes. She has a 16.50 pack-year smoking history. She has never used smokeless tobacco. She reports that she does not drink alcohol and does not use drugs.  Family History  Problem Relation Age of Onset   Hypertension Mother    Alcohol abuse Father     The following portions of the patient's history were reviewed and updated as appropriate: allergies, current medications, past family history, past medical history, past social history, past surgical history and problem list.  Review of Systems Pertinent items noted in HPI and remainder of comprehensive ROS otherwise negative.  Physical Exam:  BP 117/78    Pulse 80    Wt 228 lb (103.4 kg)    LMP 03/17/2013    BMI 40.39 kg/m  CONSTITUTIONAL: Well-developed, well-nourished female in no  acute distress.  HENT:  Normocephalic, atraumatic, External right and left ear normal.  EYES: Conjunctivae and EOM are normal. Pupils are equal, round, and reactive to light. No scleral icterus.  NECK: Normal range of motion, supple, no masses.  Normal thyroid.  SKIN: Skin is warm and dry. No rash noted. Not diaphoretic. No erythema. No pallor. MUSCULOSKELETAL: Normal range of motion. No tenderness.  No cyanosis, clubbing, or edema. NEUROLOGIC: Alert and oriented to person, place, and time. Normal reflexes, muscle tone coordination.  PSYCHIATRIC: Normal mood and affect. Normal behavior. Normal judgment and thought  content. CARDIOVASCULAR: Normal heart rate noted, regular rhythm RESPIRATORY: Clear to auscultation bilaterally. Effort and breath sounds normal, no problems with respiration noted. BREASTS: Symmetric in size. No masses, tenderness, skin changes, nipple drainage, or lymphadenopathy bilaterally. Performed in the presence of a chaperone. ABDOMEN: Soft, no distention noted.  No tenderness, rebound or guarding.  PELVIC: Normal appearing external genitalia and urethral meatus; normal appearing vaginal mucosa and cervix.  No abnormal vaginal discharge noted.  Pap smear obtained.  Normal uterine size, no other palpable masses, no uterine or adnexal tenderness.  Performed in the presence of a chaperone.   Assessment and Plan:    1. Encounter for gynecological examination with Papanicolaou smear of cervix - Cytology - PAP( [facility]) Will follow up results of pap smear and manage accordingly. Mammogram and  colon cancer screening is up to date. Routine preventative health maintenance measures emphasized. Please refer to After Visit Summary for other counseling recommendations.      Verita Schneiders, MD, [facility] [facility] [employer], [place]

## 2021-09-28 ENCOUNTER — Encounter: Payer: Self-pay | Admitting: Obstetrics & Gynecology

## 2021-10-03 LAB — CYTOLOGY - PAP
Comment: NEGATIVE
Diagnosis: NEGATIVE
High risk HPV: NEGATIVE

## 2021-10-06 ENCOUNTER — Other Ambulatory Visit (HOSPITAL_COMMUNITY): Payer: Self-pay

## 2021-10-19 ENCOUNTER — Other Ambulatory Visit (HOSPITAL_COMMUNITY): Payer: Self-pay

## 2021-10-19 DIAGNOSIS — R21 Rash and other nonspecific skin eruption: Secondary | ICD-10-CM | POA: Diagnosis not present

## 2021-10-19 DIAGNOSIS — I10 Essential (primary) hypertension: Secondary | ICD-10-CM | POA: Diagnosis not present

## 2021-10-19 DIAGNOSIS — E669 Obesity, unspecified: Secondary | ICD-10-CM | POA: Diagnosis not present

## 2021-10-19 DIAGNOSIS — G4733 Obstructive sleep apnea (adult) (pediatric): Secondary | ICD-10-CM | POA: Diagnosis not present

## 2021-10-19 DIAGNOSIS — D75839 Thrombocytosis, unspecified: Secondary | ICD-10-CM | POA: Diagnosis not present

## 2021-10-19 DIAGNOSIS — J3089 Other allergic rhinitis: Secondary | ICD-10-CM | POA: Diagnosis not present

## 2021-10-19 DIAGNOSIS — E038 Other specified hypothyroidism: Secondary | ICD-10-CM | POA: Diagnosis not present

## 2021-10-19 DIAGNOSIS — J301 Allergic rhinitis due to pollen: Secondary | ICD-10-CM | POA: Diagnosis not present

## 2021-10-19 DIAGNOSIS — J3081 Allergic rhinitis due to animal (cat) (dog) hair and dander: Secondary | ICD-10-CM | POA: Diagnosis not present

## 2021-10-19 DIAGNOSIS — Z0000 Encounter for general adult medical examination without abnormal findings: Secondary | ICD-10-CM | POA: Diagnosis not present

## 2021-10-19 DIAGNOSIS — E559 Vitamin D deficiency, unspecified: Secondary | ICD-10-CM | POA: Diagnosis not present

## 2021-10-19 DIAGNOSIS — R7303 Prediabetes: Secondary | ICD-10-CM | POA: Diagnosis not present

## 2021-10-19 DIAGNOSIS — J4520 Mild intermittent asthma, uncomplicated: Secondary | ICD-10-CM | POA: Diagnosis not present

## 2021-10-19 DIAGNOSIS — Z13220 Encounter for screening for lipoid disorders: Secondary | ICD-10-CM | POA: Diagnosis not present

## 2021-10-19 MED ORDER — EPINEPHRINE 0.3 MG/0.3ML IJ SOAJ
INTRAMUSCULAR | 2 refills | Status: DC
  Filled 2021-10-19: qty 2, 30d supply, fill #0

## 2021-10-19 MED ORDER — CETIRIZINE HCL 10 MG PO TABS
10.0000 mg | ORAL_TABLET | Freq: Every day | ORAL | 6 refills | Status: DC | PRN
  Filled 2021-10-19: qty 30, 30d supply, fill #0

## 2021-10-24 ENCOUNTER — Other Ambulatory Visit (HOSPITAL_COMMUNITY): Payer: Self-pay

## 2021-12-30 ENCOUNTER — Other Ambulatory Visit: Payer: Self-pay | Admitting: Internal Medicine

## 2021-12-30 DIAGNOSIS — Z1231 Encounter for screening mammogram for malignant neoplasm of breast: Secondary | ICD-10-CM

## 2022-01-04 ENCOUNTER — Other Ambulatory Visit: Payer: Self-pay | Admitting: Medical

## 2022-01-04 ENCOUNTER — Other Ambulatory Visit (HOSPITAL_COMMUNITY): Payer: Self-pay

## 2022-01-04 ENCOUNTER — Other Ambulatory Visit: Payer: Self-pay | Admitting: Internal Medicine

## 2022-01-04 MED ORDER — SPIRONOLACTONE 25 MG PO TABS
25.0000 mg | ORAL_TABLET | Freq: Every day | ORAL | 0 refills | Status: DC
  Filled 2022-01-04: qty 30, 30d supply, fill #0

## 2022-01-04 MED ORDER — OLMESARTAN MEDOXOMIL 20 MG PO TABS
20.0000 mg | ORAL_TABLET | Freq: Every day | ORAL | 0 refills | Status: DC
  Filled 2022-01-04: qty 30, 30d supply, fill #0

## 2022-01-05 ENCOUNTER — Other Ambulatory Visit (HOSPITAL_COMMUNITY): Payer: Self-pay

## 2022-01-05 MED ORDER — SPIRONOLACTONE 25 MG PO TABS
25.0000 mg | ORAL_TABLET | Freq: Every day | ORAL | 0 refills | Status: DC
  Filled 2022-01-05: qty 60, 60d supply, fill #0

## 2022-01-05 MED ORDER — OLMESARTAN MEDOXOMIL 20 MG PO TABS
20.0000 mg | ORAL_TABLET | Freq: Every day | ORAL | 0 refills | Status: DC
  Filled 2022-01-05: qty 60, 60d supply, fill #0

## 2022-01-09 ENCOUNTER — Other Ambulatory Visit (HOSPITAL_COMMUNITY): Payer: Self-pay

## 2022-01-11 ENCOUNTER — Other Ambulatory Visit (HOSPITAL_COMMUNITY): Payer: Self-pay

## 2022-01-11 MED ORDER — OLMESARTAN MEDOXOMIL 20 MG PO TABS
20.0000 mg | ORAL_TABLET | Freq: Every day | ORAL | 0 refills | Status: DC
  Filled 2022-01-11 – 2022-01-30 (×2): qty 90, 90d supply, fill #0

## 2022-01-11 MED ORDER — SPIRONOLACTONE 25 MG PO TABS
25.0000 mg | ORAL_TABLET | Freq: Every day | ORAL | 0 refills | Status: DC
  Filled 2022-01-11 – 2022-01-30 (×2): qty 90, 90d supply, fill #0

## 2022-01-30 ENCOUNTER — Other Ambulatory Visit (HOSPITAL_COMMUNITY): Payer: Self-pay

## 2022-02-02 ENCOUNTER — Other Ambulatory Visit (HOSPITAL_COMMUNITY): Payer: Self-pay

## 2022-02-13 ENCOUNTER — Ambulatory Visit
Admission: RE | Admit: 2022-02-13 | Discharge: 2022-02-13 | Disposition: A | Discharge status: Home / Self Care | Payer: BC Managed Care – PPO | Source: Ambulatory Visit | Attending: Internal Medicine | Admitting: Internal Medicine

## 2022-02-13 DIAGNOSIS — Z1231 Encounter for screening mammogram for malignant neoplasm of breast: Secondary | ICD-10-CM | POA: Diagnosis not present

## 2022-02-28 DIAGNOSIS — E041 Nontoxic single thyroid nodule: Secondary | ICD-10-CM | POA: Diagnosis not present

## 2022-03-02 ENCOUNTER — Other Ambulatory Visit: Payer: Self-pay | Admitting: Surgery

## 2022-03-02 DIAGNOSIS — E041 Nontoxic single thyroid nodule: Secondary | ICD-10-CM

## 2022-03-09 ENCOUNTER — Ambulatory Visit
Admission: RE | Admit: 2022-03-09 | Discharge: 2022-03-09 | Disposition: A | Discharge status: Home / Self Care | Payer: BC Managed Care – PPO | Source: Ambulatory Visit | Attending: Surgery | Admitting: Surgery

## 2022-03-09 DIAGNOSIS — I10 Essential (primary) hypertension: Secondary | ICD-10-CM | POA: Diagnosis not present

## 2022-03-09 DIAGNOSIS — E038 Other specified hypothyroidism: Secondary | ICD-10-CM | POA: Diagnosis not present

## 2022-03-09 DIAGNOSIS — E041 Nontoxic single thyroid nodule: Secondary | ICD-10-CM | POA: Diagnosis not present

## 2022-03-09 DIAGNOSIS — J4520 Mild intermittent asthma, uncomplicated: Secondary | ICD-10-CM | POA: Diagnosis not present

## 2022-03-09 DIAGNOSIS — E7800 Pure hypercholesterolemia, unspecified: Secondary | ICD-10-CM | POA: Diagnosis not present

## 2022-03-31 ENCOUNTER — Other Ambulatory Visit (HOSPITAL_COMMUNITY): Payer: Self-pay

## 2022-03-31 MED ORDER — TRIAMCINOLONE ACETONIDE 0.5 % EX OINT
TOPICAL_OINTMENT | Freq: Two times a day (BID) | CUTANEOUS | 0 refills | Status: AC
  Filled 2022-03-31: qty 15, 7d supply, fill #0

## 2022-05-01 ENCOUNTER — Other Ambulatory Visit (HOSPITAL_COMMUNITY): Payer: Self-pay

## 2022-05-01 MED ORDER — SPIRONOLACTONE 25 MG PO TABS
25.0000 mg | ORAL_TABLET | Freq: Every day | ORAL | 1 refills | Status: AC
  Filled 2022-05-01 (×2): qty 90, 90d supply, fill #0
  Filled 2022-08-02: qty 90, 90d supply, fill #1

## 2022-05-01 MED ORDER — OLMESARTAN MEDOXOMIL 20 MG PO TABS
20.0000 mg | ORAL_TABLET | Freq: Every day | ORAL | 1 refills | Status: AC
  Filled 2022-05-01 (×2): qty 90, 90d supply, fill #0
  Filled 2022-08-02: qty 90, 90d supply, fill #1

## 2022-05-16 DIAGNOSIS — H04123 Dry eye syndrome of bilateral lacrimal glands: Secondary | ICD-10-CM | POA: Diagnosis not present

## 2022-05-16 DIAGNOSIS — H43811 Vitreous degeneration, right eye: Secondary | ICD-10-CM | POA: Diagnosis not present

## 2022-05-16 DIAGNOSIS — D492 Neoplasm of unspecified behavior of bone, soft tissue, and skin: Secondary | ICD-10-CM | POA: Diagnosis not present

## 2022-05-16 DIAGNOSIS — H2513 Age-related nuclear cataract, bilateral: Secondary | ICD-10-CM | POA: Diagnosis not present

## 2022-06-06 ENCOUNTER — Other Ambulatory Visit (HOSPITAL_COMMUNITY): Payer: Self-pay

## 2022-06-06 MED ORDER — ALBUTEROL SULFATE HFA 108 (90 BASE) MCG/ACT IN AERS
1.0000 | INHALATION_SPRAY | RESPIRATORY_TRACT | 1 refills | Status: AC | PRN
Start: 2022-06-06 — End: ?
  Filled 2022-06-06: qty 6.7, 33d supply, fill #0

## 2022-06-07 ENCOUNTER — Other Ambulatory Visit (HOSPITAL_COMMUNITY): Payer: Self-pay

## 2022-06-14 ENCOUNTER — Other Ambulatory Visit (HOSPITAL_COMMUNITY): Payer: Self-pay

## 2022-06-14 DIAGNOSIS — R059 Cough, unspecified: Secondary | ICD-10-CM | POA: Diagnosis not present

## 2022-06-14 DIAGNOSIS — B349 Viral infection, unspecified: Secondary | ICD-10-CM | POA: Diagnosis not present

## 2022-06-14 DIAGNOSIS — J9801 Acute bronchospasm: Secondary | ICD-10-CM | POA: Diagnosis not present

## 2022-06-14 MED ORDER — PREDNISONE 20 MG PO TABS
ORAL_TABLET | ORAL | 0 refills | Status: AC
  Filled 2022-06-14: qty 18, 9d supply, fill #0

## 2022-06-14 MED ORDER — BENZONATATE 100 MG PO CAPS
200.0000 mg | ORAL_CAPSULE | Freq: Three times a day (TID) | ORAL | 0 refills | Status: AC | PRN
  Filled 2022-06-14: qty 60, 10d supply, fill #0

## 2022-06-16 DIAGNOSIS — R311 Benign essential microscopic hematuria: Secondary | ICD-10-CM | POA: Diagnosis not present

## 2022-06-16 DIAGNOSIS — N281 Cyst of kidney, acquired: Secondary | ICD-10-CM | POA: Diagnosis not present

## 2022-08-02 ENCOUNTER — Other Ambulatory Visit (HOSPITAL_COMMUNITY): Payer: Self-pay

## 2022-08-12 ENCOUNTER — Encounter: Payer: Self-pay | Admitting: Obstetrics & Gynecology

## 2022-08-14 ENCOUNTER — Telehealth: Payer: Self-pay

## 2022-08-14 NOTE — Telephone Encounter (Signed)
Left message for pt to call office back regarding message sent through [facility]

## 2022-09-08 ENCOUNTER — Other Ambulatory Visit (HOSPITAL_COMMUNITY): Payer: Self-pay

## 2022-09-08 MED ORDER — PREDNISONE 50 MG PO TABS
ORAL_TABLET | ORAL | 0 refills | Status: DC
  Filled 2022-09-08: qty 3, 3d supply, fill #0

## 2022-09-08 MED ORDER — DIPHENHYDRAMINE HCL 50 MG PO CAPS: ORAL_CAPSULE | ORAL | 0 refills | Status: DC

## 2022-10-03 ENCOUNTER — Ambulatory Visit: Payer: BC Managed Care – PPO | Admitting: Obstetrics & Gynecology

## 2022-10-31 ENCOUNTER — Ambulatory Visit (INDEPENDENT_AMBULATORY_CARE_PROVIDER_SITE_OTHER): Payer: Federal, State, Local not specified - PPO | Admitting: Obstetrics & Gynecology

## 2022-10-31 ENCOUNTER — Encounter: Payer: Self-pay | Admitting: Obstetrics & Gynecology

## 2022-10-31 VITALS — BP 104/71 | HR 76 | Wt 218.0 lb

## 2022-10-31 DIAGNOSIS — Z01419 Encounter for gynecological examination (general) (routine) without abnormal findings: Secondary | ICD-10-CM | POA: Diagnosis not present

## 2022-10-31 DIAGNOSIS — Z1231 Encounter for screening mammogram for malignant neoplasm of breast: Secondary | ICD-10-CM

## 2022-10-31 NOTE — Progress Notes (Signed)
GYNECOLOGY ANNUAL PREVENTATIVE CARE ENCOUNTER NOTE  History:     [name] is a 57 y.o. G81P1 PMP female here for a routine annual gynecologic exam.  Current complaints: mild vaginal dryness sometimes.   Denies abnormal vaginal bleeding, discharge, pelvic pain, other problems with intercourse or other gynecologic concerns.    Gynecologic History Patient's last menstrual period was 03/17/2013. Contraception: post menopausal status Last Pap: 09/27/2021. Result was normal with negative HPV Last Mammogram: 02/13/2022.  Result was normal Last Colonoscopy: 03/17/2016.  Result was benign  Obstetric History OB History  Gravida Para Term Preterm AB Living  1         1  SAB IAB Ectopic Multiple Live Births          1    # Outcome Date GA Lbr Len/2nd Weight Sex Delivery Anes PTL Lv  1 Gravida             Past Medical History:  Diagnosis Date   Asthma    no inhaler   Hypertension    PONV (postoperative nausea and vomiting) 2006    Past Surgical History:  Procedure Laterality Date   BREAST BIOPSY Right 11/2018   benign   CESAREAN SECTION  1995   HYSTEROSCOPY N/A 12/13/2012   Procedure: HYSTEROSCOPY/IUD REMOVAL;  Surgeon: Marvene Staff, MD;  Location: [facility];  Service: Gynecology;  Laterality: N/A;   TUBAL LIGATION  2006    Current Outpatient Medications on File Prior to Visit  Medication Sig Dispense Refill   albuterol (VENTOLIN HFA) 108 (90 Base) MCG/ACT inhaler Inhale 1 puff into the lungs every 4 (four) to 6 (six) hours as needed. 6.7 g 1   benzonatate (TESSALON PERLES) 100 MG capsule Take 2 capsules (200 mg total) by mouth 3 (three) times daily for cough 60 capsule 0   Cyanocobalamin (B-12 PO) Take by mouth.     olmesartan (BENICAR) 20 MG tablet Take 1 tablet (20 mg total) by mouth daily. 90 tablet 1   spironolactone (ALDACTONE) 25 MG tablet Take 1 tablet (25 mg total) by mouth daily. 90 tablet 1   Vitamin D, Cholecalciferol, 1000 units CAPS Take 1,000 mg  by mouth daily.     triamcinolone ointment (KENALOG) 0.5 % Apply 1 application topically 2 (two) times daily for 7 days 15 g 0   No current facility-administered medications on file prior to visit.    Allergies  Allergen Reactions   Peanut-Containing Drug Products Itching    Walnuts, pecans (tree nuts)   Shellfish Allergy Hives and Itching    Social History:  reports that she quit smoking about 10 years ago. Her smoking use included cigarettes. She has a 16.50 pack-year smoking history. She has never used smokeless tobacco. She reports that she does not drink alcohol and does not use drugs.  Family History  Problem Relation Age of Onset   Breast cancer Mother 28   Hypertension Mother    Alcohol abuse Father     The following portions of the patient's history were reviewed and updated as appropriate: allergies, current medications, past family history, past medical history, past social history, past surgical history and problem list.  Review of Systems Pertinent items noted in HPI and remainder of comprehensive ROS otherwise negative.  Physical Exam:  BP 104/71   Pulse 76   Wt 218 lb (98.9 kg)   LMP 03/17/2013   BMI 38.62 kg/m  CONSTITUTIONAL: Well-developed, well-nourished female in no acute distress.  HENT:  Normocephalic,  atraumatic, External right and left ear normal.  EYES: Conjunctivae and EOM are normal. Pupils are equal, round, and reactive to light. No scleral icterus.  NECK: Normal range of motion, supple, no masses.  Normal thyroid.  SKIN: Skin is warm and dry. No rash noted. Not diaphoretic. No erythema. No pallor. MUSCULOSKELETAL: Normal range of motion. No tenderness.  No cyanosis, clubbing, or edema. NEUROLOGIC: Alert and oriented to person, place, and time. Normal reflexes, muscle tone coordination.  PSYCHIATRIC: Normal mood and affect. Normal behavior. Normal judgment and thought content. CARDIOVASCULAR: Normal heart rate noted, regular rhythm RESPIRATORY:  Clear to auscultation bilaterally. Effort and breath sounds normal, no problems with respiration noted. BREASTS: Symmetric in size. No masses, tenderness, skin changes, nipple drainage, or lymphadenopathy bilaterally. Performed in the presence of a chaperone. ABDOMEN: Soft, no distention noted.  No tenderness, rebound or guarding.  PELVIC: Deferred   Assessment and Plan:     1. Breast cancer screening by mammogram - MM 3D SCREENING MAMMOGRAM BILATERAL BREAST; Future Mammogram scheduled  2. Well woman exam Discussed use of water or silicone based hypoallergenic vaginal lubricants or vaginal hyaluronic acid moisturizers as needed for vaginal dryness. Pap smear and colon cancer screening is up to date. Routine preventative health maintenance measures emphasized. Please refer to After Visit Summary for other counseling recommendations.      Verita Schneiders, MD, [place] for [employer], [place]

## 2022-10-31 NOTE — Patient Instructions (Signed)
[facility] [place]  Water or silicone based hypoallergenic lubricants

## 2022-11-08 ENCOUNTER — Other Ambulatory Visit (HOSPITAL_COMMUNITY): Payer: Self-pay

## 2022-11-08 MED ORDER — OLMESARTAN MEDOXOMIL 20 MG PO TABS
20.0000 mg | ORAL_TABLET | Freq: Every day | ORAL | 0 refills | Status: DC
  Filled 2022-11-08: qty 90, 90d supply, fill #0

## 2022-11-08 MED ORDER — SPIRONOLACTONE 25 MG PO TABS
25.0000 mg | ORAL_TABLET | Freq: Every day | ORAL | 0 refills | Status: DC
  Filled 2022-11-08: qty 90, 90d supply, fill #0

## 2022-11-22 DIAGNOSIS — Z0000 Encounter for general adult medical examination without abnormal findings: Secondary | ICD-10-CM | POA: Diagnosis not present

## 2022-11-22 DIAGNOSIS — G4733 Obstructive sleep apnea (adult) (pediatric): Secondary | ICD-10-CM | POA: Diagnosis not present

## 2022-11-22 DIAGNOSIS — N200 Calculus of kidney: Secondary | ICD-10-CM | POA: Diagnosis not present

## 2022-11-22 DIAGNOSIS — N281 Cyst of kidney, acquired: Secondary | ICD-10-CM | POA: Diagnosis not present

## 2022-11-22 DIAGNOSIS — R311 Benign essential microscopic hematuria: Secondary | ICD-10-CM | POA: Diagnosis not present

## 2022-11-27 DIAGNOSIS — Z0183 Encounter for blood typing: Secondary | ICD-10-CM | POA: Diagnosis not present

## 2022-11-27 DIAGNOSIS — I10 Essential (primary) hypertension: Secondary | ICD-10-CM | POA: Diagnosis not present

## 2022-11-27 DIAGNOSIS — R7303 Prediabetes: Secondary | ICD-10-CM | POA: Diagnosis not present

## 2022-11-27 DIAGNOSIS — E7800 Pure hypercholesterolemia, unspecified: Secondary | ICD-10-CM | POA: Diagnosis not present

## 2022-11-27 DIAGNOSIS — E038 Other specified hypothyroidism: Secondary | ICD-10-CM | POA: Diagnosis not present

## 2022-11-27 DIAGNOSIS — D75839 Thrombocytosis, unspecified: Secondary | ICD-10-CM | POA: Diagnosis not present

## 2022-11-29 ENCOUNTER — Other Ambulatory Visit (HOSPITAL_COMMUNITY): Payer: Self-pay

## 2022-11-29 MED ORDER — TRIAMCINOLONE ACETONIDE 0.5 % EX OINT
1.0000 | TOPICAL_OINTMENT | Freq: Two times a day (BID) | CUTANEOUS | 0 refills | Status: AC
  Filled 2022-11-29: qty 15, 8d supply, fill #0

## 2022-12-07 ENCOUNTER — Other Ambulatory Visit (HOSPITAL_BASED_OUTPATIENT_CLINIC_OR_DEPARTMENT_OTHER): Payer: Self-pay

## 2023-01-04 ENCOUNTER — Encounter: Payer: Self-pay | Admitting: Obstetrics & Gynecology

## 2023-02-05 ENCOUNTER — Other Ambulatory Visit (HOSPITAL_COMMUNITY): Payer: Self-pay

## 2023-02-05 MED ORDER — OLMESARTAN MEDOXOMIL 20 MG PO TABS
20.0000 mg | ORAL_TABLET | Freq: Every day | ORAL | 3 refills | Status: AC
  Filled 2023-02-05: qty 90, 90d supply, fill #0

## 2023-02-05 MED ORDER — SPIRONOLACTONE 25 MG PO TABS
25.0000 mg | ORAL_TABLET | Freq: Every day | ORAL | 3 refills | Status: AC
Start: 2023-02-05 — End: ?
  Filled 2023-02-05: qty 90, 90d supply, fill #0

## 2023-02-07 ENCOUNTER — Other Ambulatory Visit (HOSPITAL_COMMUNITY): Payer: Self-pay

## 2023-02-12 ENCOUNTER — Other Ambulatory Visit (HOSPITAL_COMMUNITY): Payer: Self-pay

## 2023-02-12 DIAGNOSIS — R059 Cough, unspecified: Secondary | ICD-10-CM | POA: Diagnosis not present

## 2023-02-12 DIAGNOSIS — J45909 Unspecified asthma, uncomplicated: Secondary | ICD-10-CM | POA: Diagnosis not present

## 2023-02-12 DIAGNOSIS — R52 Pain, unspecified: Secondary | ICD-10-CM | POA: Diagnosis not present

## 2023-02-12 DIAGNOSIS — I10 Essential (primary) hypertension: Secondary | ICD-10-CM | POA: Diagnosis not present

## 2023-02-12 MED ORDER — BENZONATATE 200 MG PO CAPS
200.0000 mg | ORAL_CAPSULE | Freq: Three times a day (TID) | ORAL | 0 refills | Status: AC
  Filled 2023-02-12: qty 30, 10d supply, fill #0

## 2023-02-14 ENCOUNTER — Other Ambulatory Visit (HOSPITAL_COMMUNITY): Payer: Self-pay

## 2023-02-14 MED ORDER — SULFAMETHOXAZOLE-TRIMETHOPRIM 800-160 MG PO TABS
1.0000 | ORAL_TABLET | Freq: Two times a day (BID) | ORAL | 0 refills | Status: AC
  Filled 2023-02-14: qty 14, 7d supply, fill #0

## 2023-02-15 ENCOUNTER — Ambulatory Visit
Admission: RE | Admit: 2023-02-15 | Discharge: 2023-02-15 | Disposition: A | Discharge status: Home / Self Care | Payer: Federal, State, Local not specified - PPO | Source: Ambulatory Visit | Attending: Obstetrics & Gynecology | Admitting: Obstetrics & Gynecology

## 2023-02-15 DIAGNOSIS — Z1231 Encounter for screening mammogram for malignant neoplasm of breast: Secondary | ICD-10-CM | POA: Diagnosis not present

## 2023-03-12 ENCOUNTER — Encounter: Payer: Self-pay | Admitting: Obstetrics & Gynecology

## 2023-03-14 ENCOUNTER — Ambulatory Visit (INDEPENDENT_AMBULATORY_CARE_PROVIDER_SITE_OTHER): Payer: Federal, State, Local not specified - PPO

## 2023-03-14 VITALS — BP 109/68 | HR 68 | Wt 200.0 lb

## 2023-03-14 DIAGNOSIS — R3989 Other symptoms and signs involving the genitourinary system: Secondary | ICD-10-CM | POA: Diagnosis not present

## 2023-03-14 LAB — POCT URINALYSIS DIPSTICK

## 2023-03-14 NOTE — Progress Notes (Signed)
SUBJECTIVE:  57 y.o. female complains of  bladder pressure x 1.5 weeks  Denies abnormal vaginal bleeding or significant pelvic pain or fever.Denies history of known exposure to STD.  Patient's last menstrual period was 03/17/2013.  OBJECTIVE:  She appears alert, well appearing, in no apparent distress Urine dipstick: positive for RBC's.  ASSESSMENT:  Urinary pressure     PLAN:  Urine Culture sent off and treatment yo be determined once lab results are received ROV prn if symptoms persist or worsen.

## 2023-03-28 ENCOUNTER — Other Ambulatory Visit: Payer: Self-pay | Admitting: Surgery

## 2023-03-28 DIAGNOSIS — E041 Nontoxic single thyroid nodule: Secondary | ICD-10-CM

## 2023-04-10 ENCOUNTER — Ambulatory Visit
Admission: RE | Admit: 2023-04-10 | Discharge: 2023-04-10 | Disposition: A | Discharge status: Home / Self Care | Payer: Federal, State, Local not specified - PPO | Source: Ambulatory Visit | Attending: Surgery | Admitting: Surgery

## 2023-04-10 DIAGNOSIS — E042 Nontoxic multinodular goiter: Secondary | ICD-10-CM | POA: Diagnosis not present

## 2023-04-10 DIAGNOSIS — E041 Nontoxic single thyroid nodule: Secondary | ICD-10-CM

## 2023-06-05 ENCOUNTER — Other Ambulatory Visit (HOSPITAL_COMMUNITY): Payer: Self-pay

## 2023-06-05 DIAGNOSIS — J4520 Mild intermittent asthma, uncomplicated: Secondary | ICD-10-CM | POA: Diagnosis not present

## 2023-06-05 DIAGNOSIS — D75839 Thrombocytosis, unspecified: Secondary | ICD-10-CM | POA: Diagnosis not present

## 2023-06-05 DIAGNOSIS — I10 Essential (primary) hypertension: Secondary | ICD-10-CM | POA: Diagnosis not present

## 2023-06-05 DIAGNOSIS — E038 Other specified hypothyroidism: Secondary | ICD-10-CM | POA: Diagnosis not present

## 2023-06-05 DIAGNOSIS — G4733 Obstructive sleep apnea (adult) (pediatric): Secondary | ICD-10-CM | POA: Diagnosis not present

## 2023-06-05 DIAGNOSIS — E7800 Pure hypercholesterolemia, unspecified: Secondary | ICD-10-CM | POA: Diagnosis not present

## 2023-06-05 MED ORDER — SPIRONOLACTONE 25 MG PO TABS
25.0000 mg | ORAL_TABLET | Freq: Every day | ORAL | 1 refills | Status: AC
  Filled 2023-06-05: qty 90, 90d supply, fill #0

## 2023-06-05 MED ORDER — OLMESARTAN MEDOXOMIL 20 MG PO TABS
20.0000 mg | ORAL_TABLET | Freq: Every day | ORAL | 1 refills | Status: AC
  Filled 2023-06-05: qty 90, 90d supply, fill #0
  Filled 2023-09-13: qty 90, 90d supply, fill #1

## 2023-06-06 ENCOUNTER — Other Ambulatory Visit (HOSPITAL_COMMUNITY): Payer: Self-pay

## 2023-08-20 DIAGNOSIS — R311 Benign essential microscopic hematuria: Secondary | ICD-10-CM | POA: Diagnosis not present

## 2023-08-20 DIAGNOSIS — N200 Calculus of kidney: Secondary | ICD-10-CM | POA: Diagnosis not present

## 2023-08-22 DIAGNOSIS — H2513 Age-related nuclear cataract, bilateral: Secondary | ICD-10-CM | POA: Diagnosis not present

## 2023-08-22 DIAGNOSIS — H43811 Vitreous degeneration, right eye: Secondary | ICD-10-CM | POA: Diagnosis not present

## 2023-08-22 DIAGNOSIS — H04123 Dry eye syndrome of bilateral lacrimal glands: Secondary | ICD-10-CM | POA: Diagnosis not present

## 2023-08-22 DIAGNOSIS — D492 Neoplasm of unspecified behavior of bone, soft tissue, and skin: Secondary | ICD-10-CM | POA: Diagnosis not present

## 2023-08-23 DIAGNOSIS — E042 Nontoxic multinodular goiter: Secondary | ICD-10-CM | POA: Diagnosis not present

## 2023-09-05 ENCOUNTER — Other Ambulatory Visit (HOSPITAL_COMMUNITY): Payer: Self-pay

## 2023-09-05 DIAGNOSIS — G4733 Obstructive sleep apnea (adult) (pediatric): Secondary | ICD-10-CM | POA: Diagnosis not present

## 2023-09-05 DIAGNOSIS — E042 Nontoxic multinodular goiter: Secondary | ICD-10-CM | POA: Diagnosis not present

## 2023-09-05 DIAGNOSIS — M5432 Sciatica, left side: Secondary | ICD-10-CM | POA: Diagnosis not present

## 2023-09-05 MED ORDER — PREDNISONE 20 MG PO TABS
ORAL_TABLET | ORAL | 0 refills | Status: AC
  Filled 2023-09-05: qty 12, 6d supply, fill #0

## 2023-10-05 DIAGNOSIS — M5416 Radiculopathy, lumbar region: Secondary | ICD-10-CM | POA: Diagnosis not present

## 2023-12-14 ENCOUNTER — Other Ambulatory Visit (HOSPITAL_COMMUNITY): Payer: Self-pay

## 2023-12-14 DIAGNOSIS — R7303 Prediabetes: Secondary | ICD-10-CM | POA: Diagnosis not present

## 2023-12-14 DIAGNOSIS — E7800 Pure hypercholesterolemia, unspecified: Secondary | ICD-10-CM | POA: Diagnosis not present

## 2023-12-14 DIAGNOSIS — Z0000 Encounter for general adult medical examination without abnormal findings: Secondary | ICD-10-CM | POA: Diagnosis not present

## 2023-12-14 DIAGNOSIS — E038 Other specified hypothyroidism: Secondary | ICD-10-CM | POA: Diagnosis not present

## 2023-12-14 DIAGNOSIS — D75839 Thrombocytosis, unspecified: Secondary | ICD-10-CM | POA: Diagnosis not present

## 2023-12-14 DIAGNOSIS — E559 Vitamin D deficiency, unspecified: Secondary | ICD-10-CM | POA: Diagnosis not present

## 2023-12-14 DIAGNOSIS — I10 Essential (primary) hypertension: Secondary | ICD-10-CM | POA: Diagnosis not present

## 2023-12-14 DIAGNOSIS — Z23 Encounter for immunization: Secondary | ICD-10-CM | POA: Diagnosis not present

## 2023-12-14 MED ORDER — OLMESARTAN MEDOXOMIL 20 MG PO TABS
20.0000 mg | ORAL_TABLET | Freq: Every day | ORAL | 1 refills | Status: AC
  Filled 2023-12-14: qty 30, 30d supply, fill #0
  Filled 2023-12-14: qty 60, 60d supply, fill #0
  Filled 2024-04-06: qty 90, 90d supply, fill #1

## 2024-01-24 DIAGNOSIS — G4733 Obstructive sleep apnea (adult) (pediatric): Secondary | ICD-10-CM | POA: Diagnosis not present

## 2024-02-07 ENCOUNTER — Other Ambulatory Visit: Payer: Self-pay | Admitting: Internal Medicine

## 2024-02-07 DIAGNOSIS — Z1231 Encounter for screening mammogram for malignant neoplasm of breast: Secondary | ICD-10-CM

## 2024-03-05 ENCOUNTER — Ambulatory Visit
Admission: RE | Admit: 2024-03-05 | Discharge: 2024-03-05 | Disposition: A | Discharge status: Home / Self Care | Source: Ambulatory Visit | Attending: Internal Medicine

## 2024-03-05 DIAGNOSIS — Z1231 Encounter for screening mammogram for malignant neoplasm of breast: Secondary | ICD-10-CM

## 2024-03-10 ENCOUNTER — Other Ambulatory Visit (HOSPITAL_COMMUNITY): Payer: Self-pay

## 2024-03-10 MED ORDER — ALBUTEROL SULFATE HFA 108 (90 BASE) MCG/ACT IN AERS
1.0000 | INHALATION_SPRAY | RESPIRATORY_TRACT | 0 refills | Status: DC
  Filled 2024-03-10: qty 6.7, 30d supply, fill #0

## 2024-04-03 DIAGNOSIS — G4733 Obstructive sleep apnea (adult) (pediatric): Secondary | ICD-10-CM | POA: Diagnosis not present

## 2024-04-08 ENCOUNTER — Other Ambulatory Visit (HOSPITAL_COMMUNITY): Payer: Self-pay

## 2024-06-02 ENCOUNTER — Other Ambulatory Visit (HOSPITAL_COMMUNITY): Payer: Self-pay

## 2024-06-02 DIAGNOSIS — Z23 Encounter for immunization: Secondary | ICD-10-CM | POA: Diagnosis not present

## 2024-06-02 DIAGNOSIS — J4521 Mild intermittent asthma with (acute) exacerbation: Secondary | ICD-10-CM | POA: Diagnosis not present

## 2024-06-02 MED ORDER — ALBUTEROL SULFATE HFA 108 (90 BASE) MCG/ACT IN AERS
1.0000 | INHALATION_SPRAY | RESPIRATORY_TRACT | 0 refills | Status: AC
  Filled 2024-06-02: qty 6.7, 34d supply, fill #0

## 2024-06-02 MED ORDER — AZITHROMYCIN 250 MG PO TABS
ORAL_TABLET | ORAL | 0 refills | Status: AC
  Filled 2024-06-02: qty 6, 5d supply, fill #0

## 2024-06-02 MED ORDER — PREDNISONE 20 MG PO TABS
ORAL_TABLET | ORAL | 0 refills | Status: AC
  Filled 2024-06-02: qty 12, 6d supply, fill #0

## 2024-06-05 DIAGNOSIS — G4733 Obstructive sleep apnea (adult) (pediatric): Secondary | ICD-10-CM | POA: Diagnosis not present

## 2024-06-05 DIAGNOSIS — I10 Essential (primary) hypertension: Secondary | ICD-10-CM | POA: Diagnosis not present

## 2024-06-05 DIAGNOSIS — E038 Other specified hypothyroidism: Secondary | ICD-10-CM | POA: Diagnosis not present

## 2024-06-05 DIAGNOSIS — E669 Obesity, unspecified: Secondary | ICD-10-CM | POA: Diagnosis not present

## 2024-06-24 ENCOUNTER — Other Ambulatory Visit (HOSPITAL_COMMUNITY): Payer: Self-pay

## 2024-06-24 DIAGNOSIS — J4521 Mild intermittent asthma with (acute) exacerbation: Secondary | ICD-10-CM | POA: Diagnosis not present

## 2024-06-24 DIAGNOSIS — Z03818 Encounter for observation for suspected exposure to other biological agents ruled out: Secondary | ICD-10-CM | POA: Diagnosis not present

## 2024-06-24 DIAGNOSIS — R062 Wheezing: Secondary | ICD-10-CM | POA: Diagnosis not present

## 2024-06-24 DIAGNOSIS — R051 Acute cough: Secondary | ICD-10-CM | POA: Diagnosis not present

## 2024-06-24 MED ORDER — PREDNISONE 20 MG PO TABS
ORAL_TABLET | ORAL | 0 refills | Status: AC
  Filled 2024-06-24: qty 12, 6d supply, fill #0

## 2024-06-24 MED ORDER — AMOXICILLIN-POT CLAVULANATE 875-125 MG PO TABS
1.0000 | ORAL_TABLET | Freq: Two times a day (BID) | ORAL | 0 refills | Status: AC
  Filled 2024-06-24: qty 14, 7d supply, fill #0

## 2024-06-24 MED ORDER — BENZONATATE 100 MG PO CAPS
100.0000 mg | ORAL_CAPSULE | Freq: Three times a day (TID) | ORAL | 0 refills | Status: AC | PRN
  Filled 2024-06-24: qty 30, 10d supply, fill #0

## 2024-07-07 DIAGNOSIS — G4733 Obstructive sleep apnea (adult) (pediatric): Secondary | ICD-10-CM | POA: Diagnosis not present

## 2024-07-07 DIAGNOSIS — I10 Essential (primary) hypertension: Secondary | ICD-10-CM | POA: Diagnosis not present

## 2024-07-07 DIAGNOSIS — D75839 Thrombocytosis, unspecified: Secondary | ICD-10-CM | POA: Diagnosis not present

## 2024-07-07 DIAGNOSIS — E042 Nontoxic multinodular goiter: Secondary | ICD-10-CM | POA: Diagnosis not present

## 2024-07-07 DIAGNOSIS — E559 Vitamin D deficiency, unspecified: Secondary | ICD-10-CM | POA: Diagnosis not present

## 2024-07-07 DIAGNOSIS — E7800 Pure hypercholesterolemia, unspecified: Secondary | ICD-10-CM | POA: Diagnosis not present

## 2024-07-17 ENCOUNTER — Other Ambulatory Visit (HOSPITAL_COMMUNITY): Payer: Self-pay

## 2024-07-17 MED ORDER — OLMESARTAN MEDOXOMIL 20 MG PO TABS
20.0000 mg | ORAL_TABLET | Freq: Every day | ORAL | 1 refills | Status: AC
  Filled 2024-07-17: qty 90, 90d supply, fill #0

## 2024-07-18 DIAGNOSIS — J209 Acute bronchitis, unspecified: Secondary | ICD-10-CM | POA: Diagnosis not present

## 2024-07-18 DIAGNOSIS — J329 Chronic sinusitis, unspecified: Secondary | ICD-10-CM | POA: Diagnosis not present

## 2024-07-18 DIAGNOSIS — R062 Wheezing: Secondary | ICD-10-CM | POA: Diagnosis not present

## 2024-07-18 DIAGNOSIS — Z8709 Personal history of other diseases of the respiratory system: Secondary | ICD-10-CM | POA: Diagnosis not present

## 2024-08-11 ENCOUNTER — Other Ambulatory Visit: Payer: Self-pay | Admitting: Internal Medicine

## 2024-08-11 ENCOUNTER — Other Ambulatory Visit (HOSPITAL_COMMUNITY): Payer: Self-pay

## 2024-08-11 ENCOUNTER — Ambulatory Visit
Admission: RE | Admit: 2024-08-11 | Discharge: 2024-08-11 | Disposition: A | Discharge status: Home / Self Care | Source: Ambulatory Visit | Attending: Internal Medicine | Admitting: Internal Medicine

## 2024-08-11 DIAGNOSIS — J4521 Mild intermittent asthma with (acute) exacerbation: Secondary | ICD-10-CM

## 2024-08-11 MED ORDER — BUDESONIDE-FORMOTEROL FUMARATE 160-4.5 MCG/ACT IN AERO
2.0000 | INHALATION_SPRAY | Freq: Two times a day (BID) | RESPIRATORY_TRACT | 1 refills | Status: AC
  Filled 2024-08-11: qty 10.2, 30d supply, fill #0

## 2024-08-18 ENCOUNTER — Other Ambulatory Visit: Payer: Self-pay | Admitting: Surgery

## 2024-08-18 DIAGNOSIS — E042 Nontoxic multinodular goiter: Secondary | ICD-10-CM

## 2024-08-25 ENCOUNTER — Encounter: Payer: Self-pay | Admitting: Obstetrics & Gynecology

## 2024-08-26 ENCOUNTER — Ambulatory Visit
